# Patient Record
Sex: Female | Born: 1960 | Race: Black or African American | Hispanic: No | Smoking: Current every day smoker
Health system: Southern US, Community
[De-identification: ages and names within clinical notes are randomized; demographics above are authoritative.]

## PROBLEM LIST (undated history)

## (undated) DIAGNOSIS — I10 Essential (primary) hypertension: Secondary | ICD-10-CM

## (undated) HISTORY — PX: BREAST SURGERY: SHX581

## (undated) HISTORY — DX: Essential (primary) hypertension: I10

## (undated) HISTORY — PX: HERNIA REPAIR: SHX51

---

## 2000-05-23 ENCOUNTER — Encounter: Payer: Self-pay | Admitting: Emergency Medicine

## 2000-05-23 ENCOUNTER — Emergency Department (HOSPITAL_COMMUNITY): Admission: EM | Admit: 2000-05-23 | Discharge: 2000-05-23 | Payer: Self-pay | Admitting: Emergency Medicine

## 2002-03-11 ENCOUNTER — Other Ambulatory Visit: Admission: RE | Admit: 2002-03-11 | Discharge: 2002-03-11 | Payer: Self-pay | Admitting: Family Medicine

## 2002-03-11 ENCOUNTER — Other Ambulatory Visit: Admission: RE | Admit: 2002-03-11 | Discharge: 2002-03-11 | Payer: Self-pay | Admitting: Anesthesiology

## 2004-02-12 ENCOUNTER — Emergency Department (HOSPITAL_COMMUNITY): Admission: EM | Admit: 2004-02-12 | Discharge: 2004-02-12 | Payer: Self-pay | Admitting: *Deleted

## 2005-08-23 ENCOUNTER — Ambulatory Visit: Payer: Self-pay | Admitting: Family Medicine

## 2005-08-25 ENCOUNTER — Ambulatory Visit (HOSPITAL_COMMUNITY): Admission: RE | Admit: 2005-08-25 | Discharge: 2005-08-25 | Payer: Self-pay | Admitting: Family Medicine

## 2005-09-19 ENCOUNTER — Ambulatory Visit: Payer: Self-pay | Admitting: Family Medicine

## 2005-12-26 ENCOUNTER — Emergency Department (HOSPITAL_COMMUNITY): Admission: EM | Admit: 2005-12-26 | Discharge: 2005-12-26 | Payer: Self-pay | Admitting: Family Medicine

## 2005-12-26 ENCOUNTER — Ambulatory Visit: Payer: Self-pay | Admitting: Nurse Practitioner

## 2006-08-27 ENCOUNTER — Emergency Department (HOSPITAL_COMMUNITY): Admission: EM | Admit: 2006-08-27 | Discharge: 2006-08-27 | Payer: Self-pay | Admitting: Emergency Medicine

## 2006-08-29 ENCOUNTER — Emergency Department (HOSPITAL_COMMUNITY): Admission: EM | Admit: 2006-08-29 | Discharge: 2006-08-29 | Payer: Self-pay | Admitting: Family Medicine

## 2006-09-26 ENCOUNTER — Ambulatory Visit: Payer: Self-pay | Admitting: Internal Medicine

## 2006-11-03 ENCOUNTER — Emergency Department (HOSPITAL_COMMUNITY): Admission: EM | Admit: 2006-11-03 | Discharge: 2006-11-03 | Payer: Self-pay | Admitting: Family Medicine

## 2008-05-01 ENCOUNTER — Emergency Department (HOSPITAL_COMMUNITY): Admission: EM | Admit: 2008-05-01 | Discharge: 2008-05-01 | Payer: Self-pay | Admitting: Emergency Medicine

## 2008-05-05 ENCOUNTER — Emergency Department (HOSPITAL_COMMUNITY): Admission: EM | Admit: 2008-05-05 | Discharge: 2008-05-05 | Payer: Self-pay | Admitting: Emergency Medicine

## 2009-09-23 ENCOUNTER — Emergency Department (HOSPITAL_COMMUNITY): Admission: EM | Admit: 2009-09-23 | Discharge: 2009-09-23 | Payer: Self-pay | Admitting: Family Medicine

## 2009-10-01 IMAGING — CR DG CHEST 2V
2 series · 2 of 2 positions shown · non-contrast
Comparison: 08/25/2005]

CLINICAL DATA: Cold/fever/chest pain

CHEST - 2 VIEW

[view not recorded (1 of 2)]
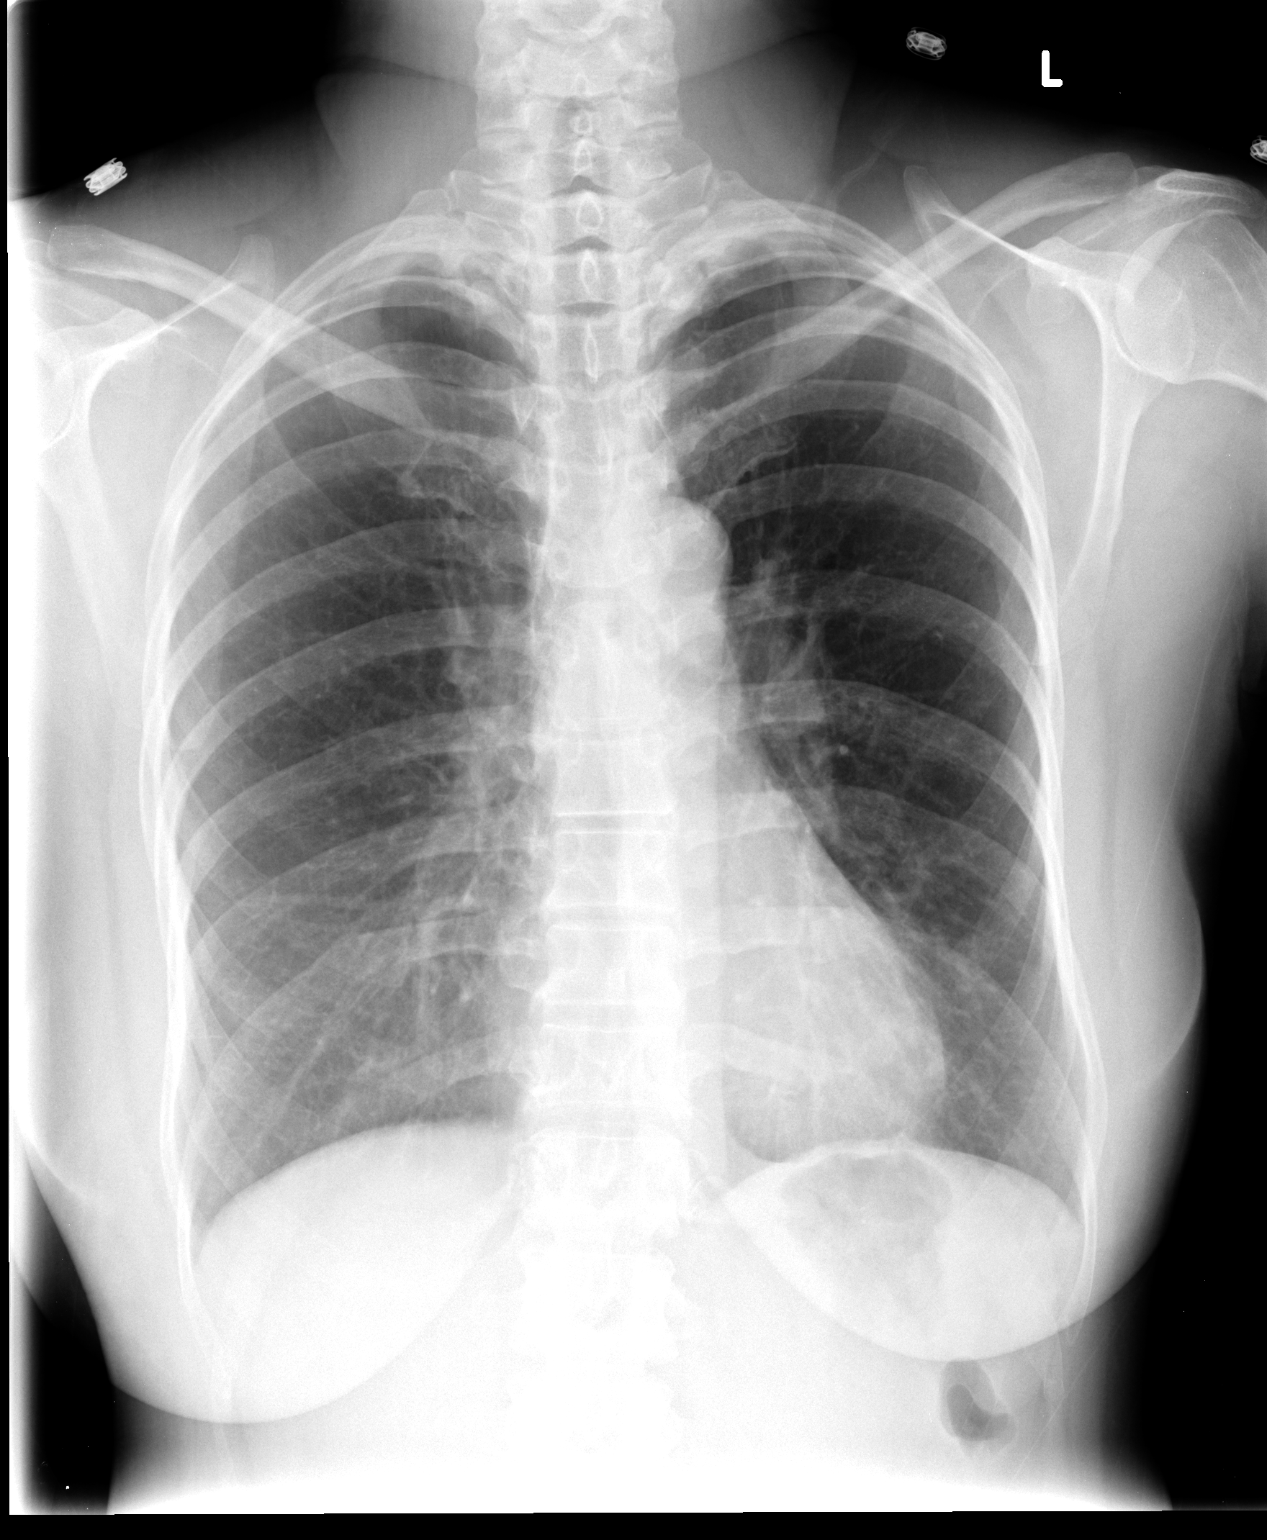

[view not recorded (2 of 2)]
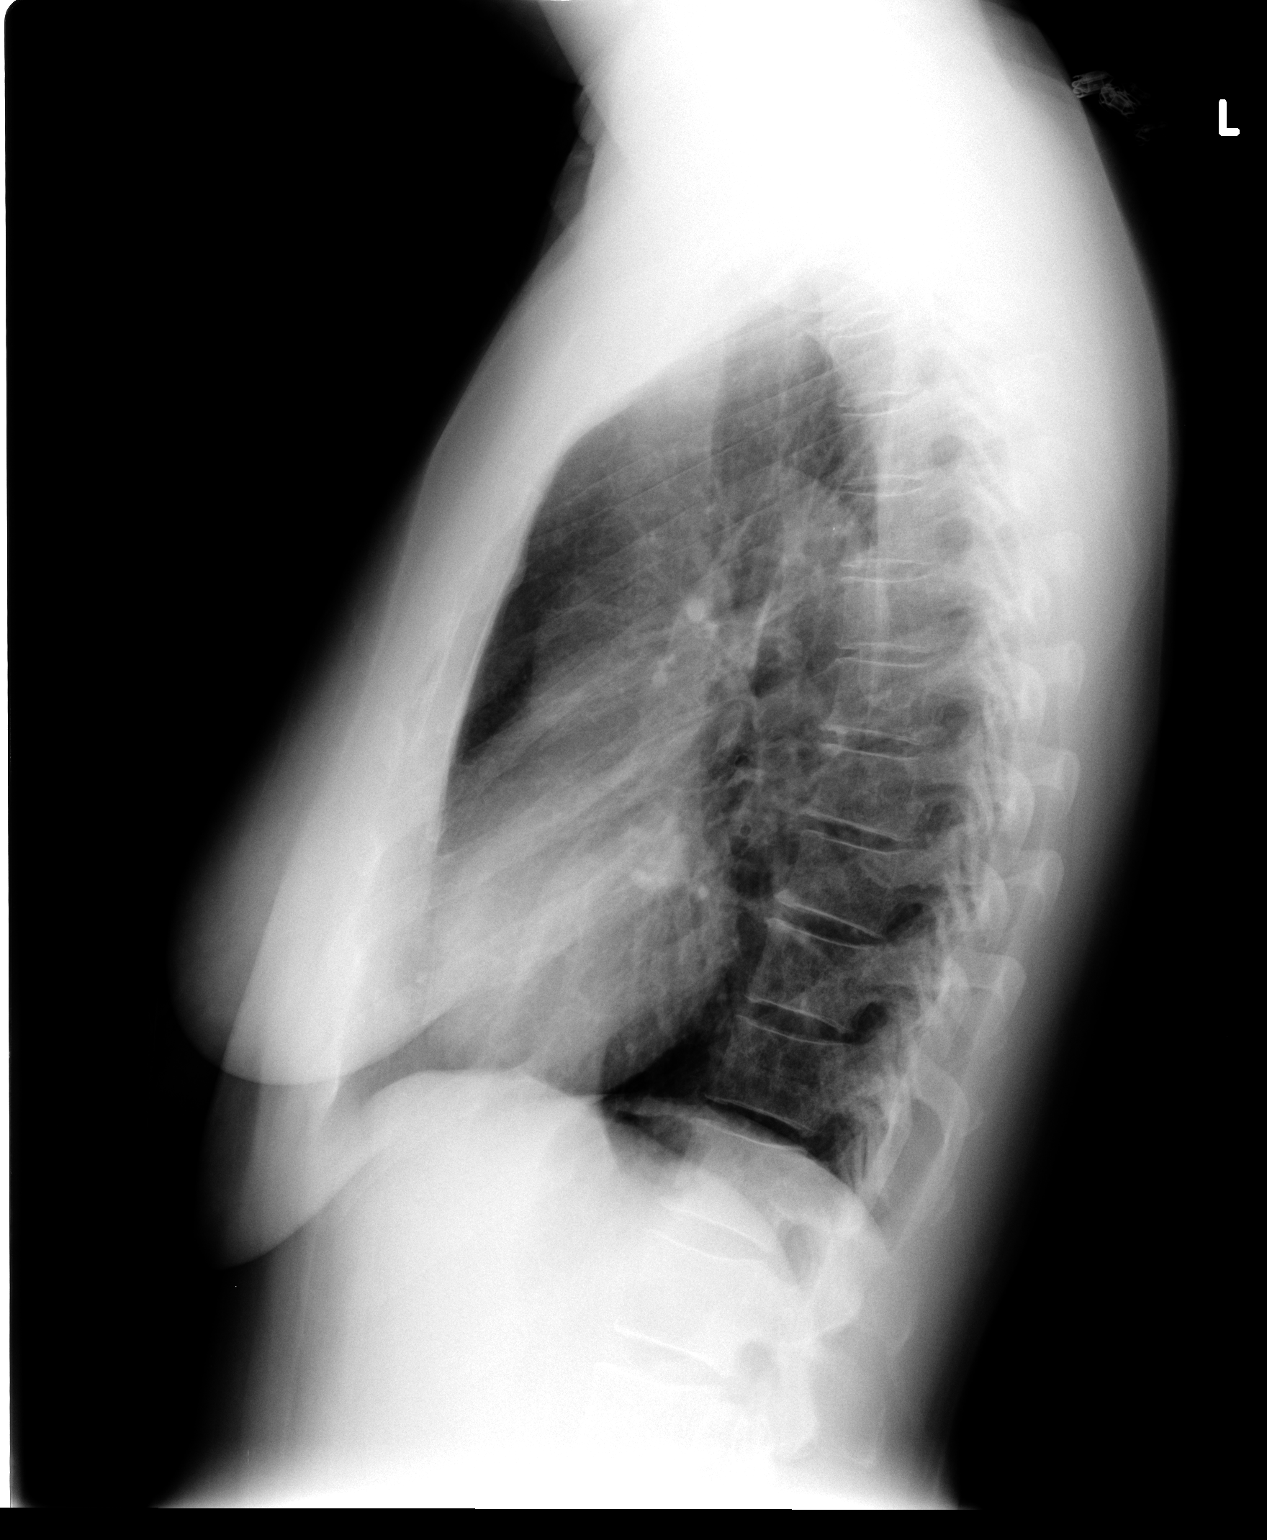

[2 of 2 positions shown; findings below may reference images not displayed]

FINDINGS: Heart and mediastinal contours normal.  Lungs clear.  No
pleural fluid or osseous lesions.
IMPRESSION: No active disease.

## 2010-12-15 ENCOUNTER — Inpatient Hospital Stay (INDEPENDENT_AMBULATORY_CARE_PROVIDER_SITE_OTHER)
Admission: RE | Admit: 2010-12-15 | Discharge: 2010-12-15 | Disposition: A | Discharge status: Home / Self Care | Payer: Self-pay | Source: Ambulatory Visit | Attending: Family Medicine | Admitting: Family Medicine

## 2010-12-15 DIAGNOSIS — M779 Enthesopathy, unspecified: Secondary | ICD-10-CM

## 2011-12-20 ENCOUNTER — Emergency Department (HOSPITAL_COMMUNITY): Payer: No Typology Code available for payment source

## 2011-12-20 ENCOUNTER — Emergency Department (HOSPITAL_COMMUNITY)
Admission: EM | Admit: 2011-12-20 | Discharge: 2011-12-20 | Disposition: A | Discharge status: Home / Self Care | Payer: No Typology Code available for payment source | Attending: Emergency Medicine | Admitting: Emergency Medicine

## 2011-12-20 ENCOUNTER — Encounter (HOSPITAL_COMMUNITY): Payer: Self-pay | Admitting: Emergency Medicine

## 2011-12-20 DIAGNOSIS — M545 Low back pain, unspecified: Secondary | ICD-10-CM | POA: Insufficient documentation

## 2011-12-20 DIAGNOSIS — S39012A Strain of muscle, fascia and tendon of lower back, initial encounter: Secondary | ICD-10-CM

## 2011-12-20 DIAGNOSIS — S93409A Sprain of unspecified ligament of unspecified ankle, initial encounter: Secondary | ICD-10-CM | POA: Insufficient documentation

## 2011-12-20 DIAGNOSIS — S339XXA Sprain of unspecified parts of lumbar spine and pelvis, initial encounter: Secondary | ICD-10-CM | POA: Insufficient documentation

## 2011-12-20 DIAGNOSIS — M25579 Pain in unspecified ankle and joints of unspecified foot: Secondary | ICD-10-CM | POA: Insufficient documentation

## 2011-12-20 DIAGNOSIS — S93401A Sprain of unspecified ligament of right ankle, initial encounter: Secondary | ICD-10-CM

## 2011-12-20 DIAGNOSIS — M79609 Pain in unspecified limb: Secondary | ICD-10-CM | POA: Insufficient documentation

## 2011-12-20 DIAGNOSIS — Y92410 Unspecified street and highway as the place of occurrence of the external cause: Secondary | ICD-10-CM | POA: Insufficient documentation

## 2011-12-20 MED ORDER — IBUPROFEN 800 MG PO TABS
800.0000 mg | ORAL_TABLET | Freq: Once | ORAL | Status: AC
  Administered 2011-12-20: 800 mg via ORAL
  Filled 2011-12-20: qty 1

## 2011-12-20 MED ORDER — IBUPROFEN 800 MG PO TABS: 800.0000 mg | ORAL_TABLET | Freq: Three times a day (TID) | ORAL | Status: AC

## 2011-12-20 MED ORDER — DIAZEPAM 5 MG PO TABS: 5.0000 mg | ORAL_TABLET | Freq: Three times a day (TID) | ORAL | Status: AC | PRN
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
## 2011-12-20 NOTE — ED Notes (Signed)
Patient transported to X-ray 
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
## 2011-12-22 NOTE — ED Provider Notes (Signed)
Medical screening examination/treatment/procedure(s) were performed by non-physician practitioner and as supervising physician I was immediately available for consultation/collaboration.  Germaine Shenker, MD 12/22/11 0849 

## 2013-01-15 ENCOUNTER — Encounter (HOSPITAL_COMMUNITY): Payer: Self-pay | Admitting: *Deleted

## 2013-01-15 ENCOUNTER — Emergency Department (INDEPENDENT_AMBULATORY_CARE_PROVIDER_SITE_OTHER)
Admission: EM | Admit: 2013-01-15 | Discharge: 2013-01-15 | Disposition: A | Discharge status: Home / Self Care | Payer: Self-pay | Source: Home / Self Care | Attending: Emergency Medicine | Admitting: Emergency Medicine

## 2013-01-15 DIAGNOSIS — K047 Periapical abscess without sinus: Secondary | ICD-10-CM

## 2013-01-15 MED ORDER — HYDROCODONE-ACETAMINOPHEN 5-325 MG PO TABS
ORAL_TABLET | ORAL | Status: AC
  Filled 2013-01-15: qty 2

## 2013-01-15 MED ORDER — PENICILLIN V POTASSIUM 500 MG PO TABS: 500.0000 mg | ORAL_TABLET | Freq: Three times a day (TID) | ORAL | Status: DC

## 2013-01-15 MED ORDER — METRONIDAZOLE 500 MG PO TABS: 500.0000 mg | ORAL_TABLET | Freq: Three times a day (TID) | ORAL | Status: DC

## 2013-01-15 MED ORDER — HYDROCODONE-ACETAMINOPHEN 5-325 MG PO TABS: ORAL_TABLET | ORAL | Status: DC

## 2013-01-15 MED ORDER — HYDROCODONE-ACETAMINOPHEN 5-325 MG PO TABS
2.0000 | ORAL_TABLET | Freq: Once | ORAL | Status: AC
  Administered 2013-01-15: 2 via ORAL

## 2013-01-15 NOTE — ED Notes (Signed)
Pt         Reports     4   Days       Of  A  Toothache           r  Side  Of  Face   X  4  Days   -     Pt  Reports  The  Symptoms  Have  Not    Been releived  By  otc  meds             She  Is  Sitting  Upright omn   Exam table  In no  Severe  Distress  Speaking in  Complete   sentances

## 2013-01-15 NOTE — ED Provider Notes (Signed)
Chief Complaint:   Chief Complaint  Patient presents with  . Dental Pain    History of Present Illness:   Andrea Price is a 52 year old female who has a six-day history of pain in her left, upper, second molar. There is swelling of the gingiva and also facial swelling. She has difficulty opening her mouth and can only open her mouth about a centimeter. She's had no fever or chills. Has had some headache. No neck pain or swelling and no difficulty breathing or swallowing.  Review of Systems:  Other than noted above, the patient denies any of the following symptoms: Systemic:  No fever, chills,  Or sweats. ENT:  No headache, ear ache, sore throat, nasal congestion, facial pain, or swelling. Lymphatic:  No adenopathy. Lungs:  No coughing, wheezing or shortness of breath.  PMFSH:  Past medical history, family history, social history, meds, and allergies were reviewed.   Physical Exam:   Vital signs:  BP 139/86  Pulse 98  Temp(Src) 98.2 F (36.8 C) (Oral)  Resp 18  SpO2 99%  LMP 12/16/2011 General:  Alert, oriented, in no distress. ENT:  TMs and canals normal.  Nasal mucosa normal. Mouth exam:  She only open her mouth about a centimeter, some mouth exam is limited. Her left, upper, second molar was tender to touch. There was no visible decay or gingival swelling. No cervical swelling of the floor the mouth or of the pharynx. Airway was patent. Neck:  No swelling or adenopathy. Lungs:  Breath sounds clear and equal bilaterally.  No wheezes, rales or rhonchi. Heart:  Regular rhythm.  No gallops or murmers. Skin:  Clear, warm and dry.   Course in Urgent Care Center:   She was given Norco 5/325 2 tablets by mouth for pain.  Assessment:  The encounter diagnosis was Dental abscess.  No evidence for airway obstruction or soft tissue swelling. She will need antibiotics followed by a visit to the dentist to see what can be done about the tooth.  Plan:   1.  The following meds were  prescribed:   Discharge Medication List as of 01/15/2013 12:16 PM    START taking these medications   Details  HYDROcodone-acetaminophen (NORCO/VICODIN) 5-325 MG per tablet 1 to 2 tabs every 4 to 6 hours as needed for pain., Print    metroNIDAZOLE (FLAGYL) 500 MG tablet Take 1 tablet (500 mg total) by mouth 3 (three) times daily., Starting 01/15/2013, Until Discontinued, Normal    penicillin v potassium (VEETID) 500 MG tablet Take 1 tablet (500 mg total) by mouth 3 (three) times daily., Starting 01/15/2013, Until Discontinued, Normal       2.  The patient was instructed in symptomatic care and handouts were given.  Suggested sleeping with head of bed elevated and hot salt water mouthwash. 3.  The patient was told to return if becoming worse in any way, if no better in 3 or 4 days, and given some red flag symptoms such as difficulty swallowing or breathing that would indicate earlier return, especially difficulty breathing. 4.  The patient was told to follow up with a dentist as soon as possible.    Reuben Likes, MD 01/15/13 2015

## 2013-01-20 ENCOUNTER — Ambulatory Visit: Payer: Self-pay

## 2013-10-11 ENCOUNTER — Encounter (HOSPITAL_COMMUNITY): Payer: Self-pay | Admitting: Emergency Medicine

## 2013-10-11 ENCOUNTER — Emergency Department (INDEPENDENT_AMBULATORY_CARE_PROVIDER_SITE_OTHER)
Admission: EM | Admit: 2013-10-11 | Discharge: 2013-10-11 | Disposition: A | Discharge status: Home / Self Care | Payer: Self-pay | Source: Home / Self Care | Attending: Family Medicine | Admitting: Family Medicine

## 2013-10-11 DIAGNOSIS — S81812A Laceration without foreign body, left lower leg, initial encounter: Secondary | ICD-10-CM

## 2013-10-11 DIAGNOSIS — Z23 Encounter for immunization: Secondary | ICD-10-CM

## 2013-10-11 DIAGNOSIS — S81809A Unspecified open wound, unspecified lower leg, initial encounter: Secondary | ICD-10-CM

## 2013-10-11 MED ORDER — TETANUS-DIPHTH-ACELL PERTUSSIS 5-2.5-18.5 LF-MCG/0.5 IM SUSP
0.5000 mL | Freq: Once | INTRAMUSCULAR | Status: AC
  Administered 2013-10-11: 0.5 mL via INTRAMUSCULAR

## 2013-10-11 MED ORDER — SULFAMETHOXAZOLE-TRIMETHOPRIM 800-160 MG PO TABS: 2.0000 | ORAL_TABLET | Freq: Two times a day (BID) | ORAL | Status: DC

## 2013-10-11 MED ORDER — TETANUS-DIPHTH-ACELL PERTUSSIS 5-2.5-18.5 LF-MCG/0.5 IM SUSP
INTRAMUSCULAR | Status: AC
  Filled 2013-10-11: qty 0.5

## 2013-10-11 NOTE — ED Notes (Signed)
Laceration to left lower leg around 2. A.m. This morning.  States slipped on ice falling on steps.  Pt has cleaned wound with peroxide and wrapped with bandage.

## 2013-10-11 NOTE — Discharge Instructions (Signed)
Thank you for coming in today. Take 2 Septra antibiotic tablets twice daily for one week Take up to 2 Aleve twice daily for pain control Return if the wound becomes very red or starts oozing pus or becomes very painful.  Return in one week for suture removal.  Keep the wound covered with ointment  Laceration Care, Adult A laceration is a cut or lesion that goes through all layers of the skin and into the tissue just beneath the skin. TREATMENT  Some lacerations may not require closure. Some lacerations may not be able to be closed due to an increased risk of infection. It is important to see your caregiver as soon as possible after an injury to minimize the risk of infection and maximize the opportunity for successful closure. If closure is appropriate, pain medicines may be given, if needed. The wound will be cleaned to help prevent infection. Your caregiver will use stitches (sutures), staples, wound glue (adhesive), or skin adhesive strips to repair the laceration. These tools bring the skin edges together to allow for faster healing and a better cosmetic outcome. However, all wounds will heal with a scar. Once the wound has healed, scarring can be minimized by covering the wound with sunscreen during the day for 1 full year. HOME CARE INSTRUCTIONS  For sutures or staples:  Keep the wound clean and dry.  If you were given a bandage (dressing), you should change it at least once a day. Also, change the dressing if it becomes wet or dirty, or as directed by your caregiver.  Wash the wound with soap and water 2 times a day. Rinse the wound off with water to remove all soap. Pat the wound dry with a clean towel.  After cleaning, apply a thin layer of the antibiotic ointment as recommended by your caregiver. This will help prevent infection and keep the dressing from sticking.  You may shower as usual after the first 24 hours. Do not soak the wound in water until the sutures are removed.  Only  take over-the-counter or prescription medicines for pain, discomfort, or fever as directed by your caregiver.  Get your sutures or staples removed as directed by your caregiver. For skin adhesive strips:  Keep the wound clean and dry.  Do not get the skin adhesive strips wet. You may bathe carefully, using caution to keep the wound dry.  If the wound gets wet, pat it dry with a clean towel.  Skin adhesive strips will fall off on their own. You may trim the strips as the wound heals. Do not remove skin adhesive strips that are still stuck to the wound. They will fall off in time. For wound adhesive:  You may briefly wet your wound in the shower or bath. Do not soak or scrub the wound. Do not swim. Avoid periods of heavy perspiration until the skin adhesive has fallen off on its own. After showering or bathing, gently pat the wound dry with a clean towel.  Do not apply liquid medicine, cream medicine, or ointment medicine to your wound while the skin adhesive is in place. This may loosen the film before your wound is healed.  If a dressing is placed over the wound, be careful not to apply tape directly over the skin adhesive. This may cause the adhesive to be pulled off before the wound is healed.  Avoid prolonged exposure to sunlight or tanning lamps while the skin adhesive is in place. Exposure to ultraviolet light in the first  year will darken the scar.  The skin adhesive will usually remain in place for 5 to 10 days, then naturally fall off the skin. Do not pick at the adhesive film. You may need a tetanus shot if:  You cannot remember when you had your last tetanus shot.  You have never had a tetanus shot. If you get a tetanus shot, your arm may swell, get red, and feel warm to the touch. This is common and not a problem. If you need a tetanus shot and you choose not to have one, there is a rare chance of getting tetanus. Sickness from tetanus can be serious. SEEK MEDICAL CARE IF:    You have redness, swelling, or increasing pain in the wound.  You see a red line that goes away from the wound.  You have yellowish-white fluid (pus) coming from the wound.  You have a fever.  You notice a bad smell coming from the wound or dressing.  Your wound breaks open before or after sutures have been removed.  You notice something coming out of the wound such as wood or glass.  Your wound is on your hand or foot and you cannot move a finger or toe. SEEK IMMEDIATE MEDICAL CARE IF:   Your pain is not controlled with prescribed medicine.  You have severe swelling around the wound causing pain and numbness or a change in color in your arm, hand, leg, or foot.  Your wound splits open and starts bleeding.  You have worsening numbness, weakness, or loss of function of any joint around or beyond the wound.  You develop painful lumps near the wound or on the skin anywhere on your body. MAKE SURE YOU:   Understand these instructions.  Will watch your condition.  Will get help right away if you are not doing well or get worse. Document Released: 08/06/2005 Document Revised: 10/29/2011 Document Reviewed: 01/30/2011 [facility] Patient Information 2014 [place], [place].

## 2013-10-11 NOTE — ED Provider Notes (Signed)
Andrea Price is a 53 y.o. female who presents to Urgent Care today for left chin laceration. Patient fell last night about 2:00 in the morning striking her anterior left shin on concrete steps. She cleaned the wound with hydrogen peroxide and applied a bandage. She presented to the urgent care approximately 11 hours after the initial injury. She denies any significant pain weakness with foot motion. She denies any limb. No radiating pain weakness or numbness. No fevers or chills. She cannot recall her last tetanus vaccination.   History reviewed. No pertinent past medical history. History  Substance Use Topics  . Smoking status: Never Smoker   . Smokeless tobacco: Not on file  . Alcohol Use: No   ROS as above Medications: No current facility-administered medications for this encounter.   Current Outpatient Prescriptions  Medication Sig Dispense Refill  . sulfamethoxazole-trimethoprim (SEPTRA DS) 800-160 MG per tablet Take 2 tablets by mouth 2 (two) times daily.  28 tablet  0    Exam:  BP 162/82  Pulse 116  Temp(Src) 98.1 F (36.7 C) (Oral)  Resp 20  SpO2 100%  LMP 12/16/2011 Gen: Well NAD SKIN: 3 cm laceration along the medial border of the skin overlying the left tibia. The laceration extends deep beyond the dermis however it does not extend into or through the fascia or to the bone.   Laceration repair left tibia:  Consent obtained and timeout performed. Skin cleaned with alcohol and 4 mL of 2% lidocaine with epinephrine were injected around the wound achieving good anesthesia. The wound was copiously irrigated with sterile saline. The wound was prepped and draped in the usual sterile fashion.  The wound edges were debrided and transient achieving good healthy tissue.  3 horizontal mattress sutures using 4-0 Prolene were used to close the wound. The Betadine was wash off of the skin and a sterile dressing was applied.  Patient tolerated the procedure well    Assessment  and Plan: 53 y.o. female with laceration to the skin overlying the left tibia. This is a contaminated and 12 hour old wound. I am concerned about the possibility of infection therefore did not use absorbable sutures.  Additionally I debrided the wound edges and copiously irrigated the wound.  Additionally we'll use prophylactic Septra antibiotics for the possibility of wound infection.  Patient will return in one week or sooner for wound recheck and suture removal.  Discussed warning signs or symptoms. Please see discharge instructions. Patient expresses understanding.    Rodolph BongEvan S Lacara Dunsworth, MD 10/11/13 [phone]

## 2013-10-18 ENCOUNTER — Encounter (HOSPITAL_COMMUNITY): Payer: Self-pay | Admitting: Emergency Medicine

## 2013-10-18 ENCOUNTER — Emergency Department (INDEPENDENT_AMBULATORY_CARE_PROVIDER_SITE_OTHER)
Admission: EM | Admit: 2013-10-18 | Discharge: 2013-10-18 | Disposition: A | Discharge status: Home / Self Care | Payer: Self-pay | Source: Home / Self Care

## 2013-10-18 DIAGNOSIS — S81009A Unspecified open wound, unspecified knee, initial encounter: Secondary | ICD-10-CM

## 2013-10-18 DIAGNOSIS — S81809A Unspecified open wound, unspecified lower leg, initial encounter: Secondary | ICD-10-CM

## 2013-10-18 DIAGNOSIS — S91009A Unspecified open wound, unspecified ankle, initial encounter: Secondary | ICD-10-CM

## 2013-10-18 DIAGNOSIS — S81819A Laceration without foreign body, unspecified lower leg, initial encounter: Secondary | ICD-10-CM

## 2013-10-18 NOTE — ED Notes (Signed)
Presents for suture removal left anterior lower leg.  Wound well-approximated; no S/S infection.

## 2013-10-18 NOTE — Discharge Instructions (Signed)
Thank you for coming in today. The steri strips should fall off in about 1 week.  Stop the antibiotics.  Come back as needed.   Scar Minimization You will have a scar anytime you have surgery and a cut is made in the skin or you have something removed from your skin (mole, skin cancer, cyst). Although scars are unavoidable following surgery, there are ways to minimize their appearance. It is important to follow all the instructions you receive from your caregiver about wound care. How your wound heals will influence the appearance of your scar. If you do not follow the wound care instructions as directed, complications such as infection may occur. Wound instructions include keeping the wound clean, moist, and not letting the wound form a scab. Some people form scars that are raised and lumpy (hypertrophic) or larger than the initial wound (keloidal). HOME CARE INSTRUCTIONS   Follow wound care instructions as directed.  Keep the wound clean by washing it with soap and water.  Keep the wound moist with provided antibiotic cream or petroleum jelly until completely healed. Moisten twice a day for about 2 weeks.  Get stitches (sutures) taken out at the scheduled time.  Avoid touching or manipulating your wound unless needed. Wash your hands thoroughly before and after touching your wound.  Follow all restrictions such as limits on exercise or work. This depends on where your scar is located.  Keep the scar protected from sunburn. Cover the scar with sunscreen/sunblock with SPF 30 or higher.  Gently massage the scar using a circular motion to help minimize the appearance of the scar. Do this only after the wound has closed and all the sutures have been removed.  For hypertrophic or keloidal scars, there are several ways to treat and minimize their appearance. Methods include compression therapy, intralesional corticosteroids, laser therapy, or surgery. These methods are performed by your  caregiver. Remember that the scar may appear lighter or darker than your normal skin color. This difference in color should even out with time. SEEK MEDICAL CARE IF:   You have a fever.  You develop signs of infection such as pain, redness, pus, and warmth.  You have questions or concerns. Document Released: 01/24/2010 Document Revised: 10/29/2011 Document Reviewed: 01/24/2010 [facility] Patient Information 2014 [place], [place].

## 2013-10-18 NOTE — ED Provider Notes (Signed)
Andrea Price is a 53 y.o. female who presents to Urgent Care today for left chin laceration. Patient was seen last week for a laceration involving her left lower leg. She's here today for suture removal. She denies any pain fevers or chills. She is taking her Bactrim antibiotic.    History reviewed. No pertinent past medical history. History  Substance Use Topics  . Smoking status: Never Smoker   . Smokeless tobacco: Not on file  . Alcohol Use: No   ROS as above Medications: No current facility-administered medications for this encounter.   No current outpatient prescriptions on file.    Exam:  BP 130/108  Pulse 95  Temp(Src) 98.1 F (36.7 C) (Oral)  Resp 19  SpO2 100%  LMP 12/16/2011 Gen: Well NAD SKIN: Well appearing laceration no erythema exudate or tenderness.  The three horizontal mattress sutures were removed and the wound was treated with Steri-Strips.  Assessment and Plan: 53 y.o. female with laceration. Followup as needed  Discussed warning signs or symptoms. Please see discharge instructions. Patient expresses understanding.    Rodolph BongEvan S Corey, MD 10/18/13 [phone]

## 2014-11-11 ENCOUNTER — Emergency Department (HOSPITAL_COMMUNITY): Payer: Self-pay

## 2014-11-11 ENCOUNTER — Encounter (HOSPITAL_COMMUNITY): Payer: Self-pay | Admitting: Emergency Medicine

## 2014-11-11 ENCOUNTER — Emergency Department (HOSPITAL_COMMUNITY)
Admission: EM | Admit: 2014-11-11 | Discharge: 2014-11-11 | Disposition: A | Discharge status: Home / Self Care | Payer: Self-pay | Attending: Emergency Medicine | Admitting: Emergency Medicine

## 2014-11-11 DIAGNOSIS — R200 Anesthesia of skin: Secondary | ICD-10-CM

## 2014-11-11 DIAGNOSIS — M79621 Pain in right upper arm: Secondary | ICD-10-CM | POA: Insufficient documentation

## 2014-11-11 DIAGNOSIS — M542 Cervicalgia: Secondary | ICD-10-CM | POA: Insufficient documentation

## 2014-11-11 DIAGNOSIS — M546 Pain in thoracic spine: Secondary | ICD-10-CM | POA: Insufficient documentation

## 2014-11-11 DIAGNOSIS — N6310 Unspecified lump in the right breast, unspecified quadrant: Secondary | ICD-10-CM

## 2014-11-11 DIAGNOSIS — N63 Unspecified lump in breast: Secondary | ICD-10-CM | POA: Insufficient documentation

## 2014-11-11 MED ORDER — IBUPROFEN 800 MG PO TABS: 800.0000 mg | ORAL_TABLET | Freq: Three times a day (TID) | ORAL | Status: DC | PRN

## 2014-11-11 NOTE — ED Provider Notes (Signed)
CSN: [id number]     Arrival date & time 11/11/14  1336 History  This chart was scribed for non-physician practitioner Andrea Price working with Azalia Bilis, MD by Andrea Price, ED Scribe. This patient was seen in room WTR7/WTR7 and the patient's care was started at 2:47 PM.    Chief Complaint  Patient presents with  . Extremity Pain   Patient is a 54 y.o. female presenting with extremity pain. The history is provided by the patient. No language interpreter was used.  Extremity Pain Pertinent negatives include no chest pain and no shortness of breath.   HPI Comments: Andrea Price is a 54 y.o. female who presents to the Emergency Department complaining of gradually worsening right arm numbness and right axillary and right upper back pain radiating to the right side of her neck that started a year ago.  It has gradually worsened.  The numbness in her right arm comes and goes and involves the entire arm.  She can feel a lump in her right breast tissue.  She does not have a history of neck injuries.  Denies any other known injury. Her last mammogram was over 30 years ago when diagnosed with lump in her breast.  She denies chest pain, SOB, fever, cough, chills, and difficulty breathing as associated symptoms. She has a family history of cancer.  She has an appointment with [facility] next week, 6 days from today.  Has not been seen for this previously.   History reviewed. No pertinent past medical history. Past Surgical History  Procedure Laterality Date  . Hernia repair     History reviewed. No pertinent family history. History  Substance Use Topics  . Smoking status: Never Smoker   . Smokeless tobacco: Not on file  . Alcohol Use: No   OB History    No data available     Review of Systems  Constitutional: Negative for fever and chills.  Respiratory: Negative for cough and shortness of breath.   Cardiovascular: Negative for chest pain.  Musculoskeletal: Positive for back pain and neck  pain. Negative for joint swelling and neck stiffness.  Skin: Negative for color change and wound.  Allergic/Immunologic: Negative for immunocompromised state.  Neurological: Positive for numbness. Negative for weakness.  Hematological: Does not bruise/bleed easily.  Psychiatric/Behavioral: Negative for self-injury.  All other systems reviewed and are negative.   Allergies  Review of patient's allergies indicates no known allergies.  Home Medications   Prior to Admission medications   Not on File   Triage Vitals: BP 178/97 mmHg  Pulse 87  Temp(Src) 98.2 F (36.8 C) (Oral)  Resp 20  SpO2 97%  LMP 12/16/2011  Physical Exam  Constitutional: She appears well-developed and well-nourished. No distress.  HENT:  Head: Normocephalic and atraumatic.  Neck: Neck supple.  Pulmonary/Chest: Effort normal.    Musculoskeletal:       Back:  Lower extremities:  Strength 5/5, sensation intact, distal pulses intact.   Spine nontender, no crepitus, or stepoffs.    Lymphadenopathy:  Right axilla generally tender.  No palpable lymphadenopathy.    Neurological: She is alert.  Skin: She is not diaphoretic.  Nursing note and vitals reviewed.   ED Course  Procedures (including critical care time)  DIAGNOSTIC STUDIES: Oxygen Saturation is 97% on room air, normal by my interpretation.    COORDINATION OF CARE: 2:58 PM- Will obtain chest, right shoulder and neck xrays.  Will order a mammogram.  The patient agreed to the treatment plan.  Labs Review Labs Reviewed - No data to display  Imaging Review Dg Cervical Spine Complete  11/11/2014   CLINICAL DATA:  Right arm numbness and pain for 1 year, no known injury, initial encounter  EXAM: CERVICAL SPINE  4+ VIEWS  COMPARISON:  None.  FINDINGS: Seven cervical segments are well visualized. Osteophytic changes are noted from C3-C6. No soft tissue changes are noted. Mild neural foraminal narrowing is noted most marked at C5-6 bilaterally. No  acute fracture is seen. The odontoid is within normal limits.  IMPRESSION: Degenerative change without acute abnormality.   Electronically Signed   By: Alcide CleverMark  Lukens M.D.   On: 11/11/2014 16:03   Dg Shoulder Right  11/11/2014   CLINICAL DATA:  Right shoulder and arm pain, no known injury, initial encounter  EXAM: RIGHT SHOULDER - 2+ VIEW  COMPARISON:  None.  FINDINGS: There is no evidence of fracture or dislocation. There is no evidence of arthropathy or other focal bone abnormality. Soft tissues are unremarkable.  IMPRESSION: No acute abnormality noted.   Electronically Signed   By: Alcide CleverMark  Lukens M.D.   On: 11/11/2014 16:04     EKG Interpretation None      MDM   Final diagnoses:  None    Afebrile, nontoxic patient with right axillary pain, right breast lump, right arm numbness and right upper back and right sided neck pain x 1 year, gradually worsening.  The right arm numbness is not dermatomal.  Neurovascularly intact.  Xrays negative.  Ordered outpatient breast and axillary US.  [facility] follow up at appointment in 6 days.    D/C home with PCP next week, Ibuprofen. Discussed result, findings, treatment, and follow up  with patient.  Pt given return precautions.  Pt verbalizes understanding and agrees with plan.        I personally performed the services described in this documentation, which was scribed in my presence. The recorded information has been reviewed and is accurate.    Andrea Dredgemily Tyeshia Cornforth, PA-C 11/11/14 1651  Azalia BilisKevin Campos, MD 11/18/14 [phone]

## 2014-11-11 NOTE — Discharge Instructions (Signed)
Read the information below.  Use the prescribed medication as directed.  Please discuss all new medications with your pharmacist.  You may return to the Emergency Department at any time for worsening condition or any new symptoms that concern you.    If you develop fevers, loss of control of bowel or bladder, weakness or numbness in your arms or legs, or are unable to walk, return to the ER for a recheck.  °

## 2014-11-11 NOTE — ED Notes (Signed)
States pain in right arm on-going for a year. States has been having increasingly intermittent right arm numbness/coolness to finger tips. Says she's felt lumps in her right breast/shoulder area recently and states cancer runs in her family. Decided to come today d/t the increasing pain and occasional numbness to arm. Arm warm/dry to touch, 1+ pulse in rt wrist.

## 2014-11-12 ENCOUNTER — Other Ambulatory Visit (HOSPITAL_COMMUNITY): Payer: Self-pay | Admitting: Physician Assistant

## 2014-11-23 ENCOUNTER — Other Ambulatory Visit (HOSPITAL_COMMUNITY): Payer: Self-pay | Admitting: Physician Assistant

## 2014-11-23 DIAGNOSIS — N644 Mastodynia: Secondary | ICD-10-CM

## 2014-11-23 DIAGNOSIS — N63 Unspecified lump in unspecified breast: Secondary | ICD-10-CM

## 2014-12-30 ENCOUNTER — Ambulatory Visit (HOSPITAL_COMMUNITY): Payer: Self-pay

## 2015-01-10 ENCOUNTER — Other Ambulatory Visit (HOSPITAL_COMMUNITY): Payer: Self-pay | Admitting: *Deleted

## 2015-01-10 DIAGNOSIS — N63 Unspecified lump in unspecified breast: Secondary | ICD-10-CM

## 2015-01-20 ENCOUNTER — Ambulatory Visit (HOSPITAL_COMMUNITY): Payer: Self-pay

## 2015-01-20 ENCOUNTER — Other Ambulatory Visit: Payer: Self-pay

## 2015-02-02 ENCOUNTER — Other Ambulatory Visit (HOSPITAL_COMMUNITY): Payer: Self-pay | Admitting: *Deleted

## 2015-02-02 DIAGNOSIS — N63 Unspecified lump in unspecified breast: Secondary | ICD-10-CM

## 2015-02-03 ENCOUNTER — Ambulatory Visit (HOSPITAL_COMMUNITY)
Admission: RE | Admit: 2015-02-03 | Discharge: 2015-02-03 | Disposition: A | Discharge status: Home / Self Care | Payer: Self-pay | Source: Ambulatory Visit | Attending: Obstetrics and Gynecology | Admitting: Obstetrics and Gynecology

## 2015-02-03 ENCOUNTER — Encounter (HOSPITAL_COMMUNITY): Payer: Self-pay

## 2015-02-03 VITALS — BP 124/72 | Temp 98.4°F | Ht 67.0 in | Wt 162.0 lb

## 2015-02-03 DIAGNOSIS — Z01419 Encounter for gynecological examination (general) (routine) without abnormal findings: Secondary | ICD-10-CM

## 2015-02-03 DIAGNOSIS — N6322 Unspecified lump in the left breast, upper inner quadrant: Secondary | ICD-10-CM

## 2015-02-03 DIAGNOSIS — N6312 Unspecified lump in the right breast, upper inner quadrant: Secondary | ICD-10-CM

## 2015-02-03 NOTE — Progress Notes (Signed)
Patient ID: Andrea Price, female   DOB: [birth year], 54 y.o.   MRN: [id number] Gave smoking cessation resources to patient and quit line information.

## 2015-02-03 NOTE — Progress Notes (Signed)
CLINIC:  [facility] ([facility]) [facility]  REASON FOR VISIT: Well-woman exam with routine gynecological exam  HISTORY OF PRESENT ILLNESS:  [name] is a 54 y.o. female who presents to the [facility] today for clinical breast exam and routine gynecological exam.  She reports feeling a lump in bilateral breasts that have been there for 1 year.  Pain is intermittent, but as high as 8/10.  She denies any skin redness or nipple discharge from either breast.  She also reports a right axillary lump that has also been there for 1 year and is painful to 10/10.  She reported recently being seen by a physician in the community at [facility], who noted bilateral breast lumps and referred her to [facility] clinic.  She denies any pelvic complaints. Her last pap smear was 20 years ago and was negative.  No history of abnormal pap smears per patient.   REVIEW OF SYSTEMS:  Denies pelvic fullness, pressure, or pain.  Breast symptoms per HPI.    ALLERGIES: No Known Allergies  CURRENT MEDICATIONS:  Current Outpatient Prescriptions on File Prior to Encounter  Medication Sig Dispense Refill  . ibuprofen (ADVIL,MOTRIN) 800 MG tablet Take 1 tablet (800 mg total) by mouth every 8 (eight) hours as needed for mild pain or moderate pain. (Patient not taking: Reported on 02/03/2015) 15 tablet 0   No current facility-administered medications on file prior to encounter.     PHYSICAL EXAM:  Vitals:  Filed Vitals:   02/03/15 1536  BP: 124/72  Temp: 98.4 F (36.9 C)    General: Well-nourished, well-appearing female in no acute distress.  She is unaccompanied in clinic today.  Stoney Bang, LPN was present during physical exam for this patient.  Breasts: Bilateral breasts exposed and observed with patient standing (arms at side, arms on hips, arms on hips flexed forward, and arms over head).  No gross abnormalities including breast skin puckering or dimpling noted on observation.   Breasts symmetrical without evidence of skin redness, thickening, or peau d'orange appearance. No nipple retraction or nipple discharge noted bilaterally.  Her breasts are extremely tender on exam, particularly in the medial and lateral aspects of both breasts. Very small right breast mobile nodule, measuring  <0.5 cm, at 10 o'clock position.  Identical very small left breast mobile nodule, measuring < 0.5 cm at 2 o'clock position.  Axillary lymph nodes: No axillary lymphadenopathy bilaterally.   GU:  -External genitalia: No lesions, swelling, or discharge. Even hair distribution as expected.  -Vagina: Pink, moist. No lesions or discharge noted in vaginal canal.  -Cervix: Cervix pink without lesions. Cervical os patent. No cervical discharge noted.  -Uterus: Bimanual exam demonstrates no uterine mass or tenderness on palpation. Uterus in normal position and normal size.  -Adnexae: Bimanual exam demonstrates no ovarian masses or tenderness on palpation.  -Rectovaginal: No lesions noted to rectum. Rectal tone intact.  No masses or nodularity palpated by bimanual rectovaginal exam.     ASSESSMENT & PLAN:   1. Breast cancer screening: [name] has bilateral palpable breast abnormalities on her clinical breast exam today.  However, these nodules are mirror images (left breast at 10 o'clock position and right breast at 2 o'clock) position.  I have a low suspicion for malignancy for these findings.  She will receive her diagnostic mammogram as scheduled.  She was given instructions and educational materials regarding breast self-awareness. [name] is aware of this plan and agrees with it.   2.  Cervical cancer screening: [name] has a normal pelvic exam today.  A pap smear was completed today per protocol.  She tolerated the procedure without complaints.  She will be contacted by one of our [employer] nurses in the next few weeks to review the results of the pap smear with the patient.     3. Smoking cessation: She currently smokes and received information on smoking cessation today.   [name] was encouraged to ask questions and all questions were answered to her satisfaction.    Lubertha Basque, NP [facility]  [phone]

## 2015-02-04 ENCOUNTER — Other Ambulatory Visit (HOSPITAL_COMMUNITY): Payer: Self-pay | Admitting: Obstetrics and Gynecology

## 2015-02-04 ENCOUNTER — Ambulatory Visit
Admission: RE | Admit: 2015-02-04 | Discharge: 2015-02-04 | Disposition: A | Discharge status: Home / Self Care | Payer: No Typology Code available for payment source | Source: Ambulatory Visit | Attending: Obstetrics and Gynecology | Admitting: Obstetrics and Gynecology

## 2015-02-04 DIAGNOSIS — N63 Unspecified lump in unspecified breast: Secondary | ICD-10-CM

## 2015-02-07 LAB — CYTOLOGY - PAP

## 2015-02-11 ENCOUNTER — Telehealth (HOSPITAL_COMMUNITY): Payer: Self-pay | Admitting: *Deleted

## 2015-02-11 NOTE — Telephone Encounter (Signed)
Telephoned patient at home # and left message to return call to [facility] 

## 2019-10-13 ENCOUNTER — Institutional Professional Consult (permissible substitution): Payer: No Typology Code available for payment source | Admitting: Pulmonary Disease

## 2019-10-27 ENCOUNTER — Institutional Professional Consult (permissible substitution): Payer: No Typology Code available for payment source | Admitting: Pulmonary Disease

## 2019-11-09 ENCOUNTER — Ambulatory Visit (INDEPENDENT_AMBULATORY_CARE_PROVIDER_SITE_OTHER): Payer: BC Managed Care – PPO | Admitting: Pulmonary Disease

## 2019-11-09 ENCOUNTER — Encounter: Payer: Self-pay | Admitting: Pulmonary Disease

## 2019-11-09 ENCOUNTER — Other Ambulatory Visit: Payer: Self-pay

## 2019-11-09 VITALS — BP 120/76 | HR 90 | Temp 97.4°F | Ht 67.0 in | Wt 166.4 lb

## 2019-11-09 DIAGNOSIS — R0681 Apnea, not elsewhere classified: Secondary | ICD-10-CM | POA: Diagnosis not present

## 2019-11-09 NOTE — Progress Notes (Signed)
Subjective:    Patient ID: Andrea Price, female    DOB: 1961/08/14, 59 y.o.   MRN: [id number]  Patient being seen for witnessed apneas  Usually goes to bed between 10 and 12 midnight Takes about 30 minutes to fall asleep Wakes up up to 5 times during the night Final wake up time about 7 AM  Has known about significant snoring for over 10 years Not really tired during the day We will only fall asleep when she is bored She notices gasping respirations at night No palpitations No headaches  Weight has been stable  She thinks her sweating at night is related to hot flashes Memory is good Dad did have OSA  Hypertension is controlled She is an active smoker, cutting down significantly, trying to quit  History reviewed. No pertinent past medical history. Social History   Socioeconomic History  . Marital status: Divorced    Spouse name: Not on file  . Number of children: Not on file  . Years of education: Not on file  . Highest education level: Not on file  Occupational History  . Not on file  Tobacco Use  . Smoking status: Current Every Day Smoker    Packs/day: 0.50    Years: 40.00    Pack years: 20.00    Types: Cigarettes  . Smokeless tobacco: Never Used  Substance and Sexual Activity  . Alcohol use: Yes    Comment: 3 times per week  . Drug use: Yes    Frequency: 7.0 times per week    Types: Marijuana  . Sexual activity: Yes    Birth control/protection: None  Other Topics Concern  . Not on file  Social History Narrative  . Not on file   Social Determinants of Health   Financial Resource Strain:   . Difficulty of Paying Living Expenses:   Food Insecurity:   . Worried About Charity fundraiser in the Last Year:   . Arboriculturist in the Last Year:   Transportation Needs:   . Film/video editor (Medical):   Marland Kitchen Lack of Transportation (Non-Medical):   Physical Activity:   . Days of Exercise per Week:   . Minutes of Exercise per Session:   Stress:    . Feeling of Stress :   Social Connections:   . Frequency of Communication with Friends and Family:   . Frequency of Social Gatherings with Friends and Family:   . Attends Religious Services:   . Active Member of Clubs or Organizations:   . Attends Archivist Meetings:   Marland Kitchen Marital Status:   Intimate Partner Violence:   . Fear of Current or Ex-Partner:   . Emotionally Abused:   Marland Kitchen Physically Abused:   . Sexually Abused:    Family History  Problem Relation Age of Onset  . Hypertension Father   . Cancer Maternal Aunt        pancreatic  . Cancer Paternal Aunt   . Stroke Paternal Grandmother    Review of Systems  Respiratory: Positive for apnea and shortness of breath.   Psychiatric/Behavioral: Positive for sleep disturbance.      Objective:   Physical Exam Constitutional:      Appearance: Normal appearance.  HENT:     Head: Normocephalic.     Right Ear: Tympanic membrane normal.     Nose: Nose normal. No congestion.     Mouth/Throat:     Mouth: Mucous membranes are moist.  Comments: Mallampati 3, crowded oropharynx Eyes:     Pupils: Pupils are equal, round, and reactive to light.  Cardiovascular:     Rate and Rhythm: Normal rate and regular rhythm.     Pulses: Normal pulses.     Heart sounds: Normal heart sounds. No murmur. No friction rub.  Pulmonary:     Effort: Pulmonary effort is normal. No respiratory distress.     Breath sounds: Normal breath sounds. No stridor. No wheezing or rhonchi.  Musculoskeletal:     Cervical back: Normal range of motion and neck supple.  Neurological:     General: No focal deficit present.     Mental Status: She is alert.  Psychiatric:        Mood and Affect: Mood normal.    Vitals:   11/09/19 0946  BP: 120/76  Pulse: 90  Temp: (!) 97.4 F (36.3 C)  SpO2: 98%   Results of the Epworth flowsheet 11/09/2019  Sitting and reading 0  Watching TV 2  Sitting, inactive in a public place (e.g. a theatre or a meeting) 0   As a passenger in a car for an hour without a break 0  Lying down to rest in the afternoon when circumstances permit 1  Sitting and talking to someone 0  Sitting quietly after a lunch without alcohol 0  In a car, while stopped for a few minutes in traffic 0  Total score 3      Assessment & Plan:  .  Moderate probability of significant obstructive sleep apnea -Witnessed apneas -Gasping at night  Active smoker  Probable obstructive lung disease-PFT reportedly with moderate obstructive defect, not available at present  Pathophysiology of sleep disordered breathing discussed with the patient Options of treatment for sleep disordered breathing discussed with the patient  She does not appear to have significant daytime symptoms, comorbidities appear well controlled at present  Plan: We will schedule patient for a home sleep study  Counseling regarding quitting smoking  We will follow-up in 3 months

## 2019-11-09 NOTE — Patient Instructions (Signed)
Moderate probability of significant obstructive sleep apnea  We will schedule you for home sleep study Update you with results  Treatment options as we discussed  Continue to focus on quitting smoking  I will see you back in the office in about 3 months  Call with significant concerns  Sleep Apnea Sleep apnea is a condition in which breathing pauses or becomes shallow during sleep. Episodes of sleep apnea usually last 10 seconds or longer, and they may occur as many as 20 times an hour. Sleep apnea disrupts your sleep and keeps your body from getting the rest that it needs. This condition can increase your risk of certain health problems, including:  Heart attack.  Stroke.  Obesity.  Diabetes.  Heart failure.  Irregular heartbeat. What are the causes? There are three kinds of sleep apnea:  Obstructive sleep apnea. This kind is caused by a blocked or collapsed airway.  Central sleep apnea. This kind happens when the part of the brain that controls breathing does not send the correct signals to the muscles that control breathing.  Mixed sleep apnea. This is a combination of obstructive and central sleep apnea. The most common cause of this condition is a collapsed or blocked airway. An airway can collapse or become blocked if:  Your throat muscles are abnormally relaxed.  Your tongue and tonsils are larger than normal.  You are overweight.  Your airway is smaller than normal. What increases the risk? You are more likely to develop this condition if you:  Are overweight.  Smoke.  Have a smaller than normal airway.  Are elderly.  Are female.  Drink alcohol.  Take sedatives or tranquilizers.  Have a family history of sleep apnea. What are the signs or symptoms? Symptoms of this condition include:  Trouble staying asleep.  Daytime sleepiness and tiredness.  Irritability.  Loud snoring.  Morning headaches.  Trouble  concentrating.  Forgetfulness.  Decreased interest in sex.  Unexplained sleepiness.  Mood swings.  Personality changes.  Feelings of depression.  Waking up often during the night to urinate.  Dry mouth.  Sore throat. How is this diagnosed? This condition may be diagnosed with:  A medical history.  A physical exam.  A series of tests that are done while you are sleeping (sleep study). These tests are usually done in a sleep lab, but they may also be done at home. How is this treated? Treatment for this condition aims to restore normal breathing and to ease symptoms during sleep. It may involve managing health issues that can affect breathing, such as high blood pressure or obesity. Treatment may include:  Sleeping on your side.  Using a decongestant if you have nasal congestion.  Avoiding the use of depressants, including alcohol, sedatives, and narcotics.  Losing weight if you are overweight.  Making changes to your diet.  Quitting smoking.  Using a device to open your airway while you sleep, such as: ? An oral appliance. This is a custom-made mouthpiece that shifts your lower jaw forward. ? A continuous positive airway pressure (CPAP) device. This device blows air through a mask when you breathe out (exhale). ? A nasal expiratory positive airway pressure (EPAP) device. This device has valves that you put into each nostril. ? A bi-level positive airway pressure (BPAP) device. This device blows air through a mask when you breathe in (inhale) and breathe out (exhale).  Having surgery if other treatments do not work. During surgery, excess tissue is removed to create a wider  airway. It is important to get treatment for sleep apnea. Without treatment, this condition can lead to:  High blood pressure.  Coronary artery disease.  In men, an inability to achieve or maintain an erection (impotence).  Reduced thinking abilities. Follow these instructions at  home: Lifestyle  Make any lifestyle changes that your health care provider recommends.  Eat a healthy, well-balanced diet.  Take steps to lose weight if you are overweight.  Avoid using depressants, including alcohol, sedatives, and narcotics.  Do not use any products that contain nicotine or tobacco, such as cigarettes, e-cigarettes, and chewing tobacco. If you need help quitting, ask your health care provider. General instructions  Take over-the-counter and prescription medicines only as told by your health care provider.  If you were given a device to open your airway while you sleep, use it only as told by your health care provider.  If you are having surgery, make sure to tell your health care provider you have sleep apnea. You may need to bring your device with you.  Keep all follow-up visits as told by your health care provider. This is important. Contact a health care provider if:  The device that you received to open your airway during sleep is uncomfortable or does not seem to be working.  Your symptoms do not improve.  Your symptoms get worse. Get help right away if:  You develop: ? Chest pain. ? Shortness of breath. ? Discomfort in your back, arms, or stomach.  You have: ? Trouble speaking. ? Weakness on one side of your body. ? Drooping in your face. These symptoms may represent a serious problem that is an emergency. Do not wait to see if the symptoms will go away. Get medical help right away. Call your local emergency services (911 in the [place] Do not drive yourself to the hospital. Summary  Sleep apnea is a condition in which breathing pauses or becomes shallow during sleep.  The most common cause is a collapsed or blocked airway.  The goal of treatment is to restore normal breathing and to ease symptoms during sleep. This information is not intended to replace advice given to you by your health care provider. Make sure you discuss any questions you  have with your health care provider. Document Revised: 01/21/2019 Document Reviewed: 04/01/2018 Elsevier Patient Education  [place].

## 2019-11-18 ENCOUNTER — Ambulatory Visit: Payer: BC Managed Care – PPO

## 2019-11-18 ENCOUNTER — Other Ambulatory Visit: Payer: Self-pay

## 2019-11-18 DIAGNOSIS — G4733 Obstructive sleep apnea (adult) (pediatric): Secondary | ICD-10-CM | POA: Diagnosis not present

## 2019-11-18 DIAGNOSIS — R0681 Apnea, not elsewhere classified: Secondary | ICD-10-CM

## 2019-11-24 DIAGNOSIS — G4733 Obstructive sleep apnea (adult) (pediatric): Secondary | ICD-10-CM | POA: Diagnosis not present

## 2019-11-25 ENCOUNTER — Telehealth: Payer: Self-pay | Admitting: Pulmonary Disease

## 2019-11-25 NOTE — Telephone Encounter (Signed)
ATC patient.  LM for Patient to return call for sleep results.    Impressions:  Mild obstructive sleep apnea Mild oxygent desaturations  Patient denies significant symptoms of sleep disordered breathing.  Sleep position optimization by encouraging sleep in a lateral position, elevating the head of the bed bu 30 degrees may help.  Encourage regular exercise  Optimize sleep hygiene and obtain at least 6-7 hours of sleep.  Increase in weight will likely lead to worsening sleep disordered breathing.  Caution against driving when sleepy and against medications with sedative effects.

## 2019-11-25 NOTE — Telephone Encounter (Signed)
Patient returned called.  Dr. Trena Platt results and recommendations given.  Understanding stated.  Nothing further at this time.

## 2020-02-26 ENCOUNTER — Encounter (HOSPITAL_COMMUNITY): Payer: Self-pay

## 2020-02-26 ENCOUNTER — Ambulatory Visit (HOSPITAL_COMMUNITY)
Admission: EM | Admit: 2020-02-26 | Discharge: 2020-02-26 | Disposition: A | Discharge status: Home / Self Care | Payer: BC Managed Care – PPO | Attending: Family Medicine | Admitting: Family Medicine

## 2020-02-26 ENCOUNTER — Other Ambulatory Visit: Payer: Self-pay

## 2020-02-26 DIAGNOSIS — L299 Pruritus, unspecified: Secondary | ICD-10-CM | POA: Diagnosis not present

## 2020-02-26 DIAGNOSIS — R208 Other disturbances of skin sensation: Secondary | ICD-10-CM

## 2020-02-26 MED ORDER — TRIAMCINOLONE ACETONIDE 0.1 % EX CREA: 1.0000 "application " | TOPICAL_CREAM | Freq: Two times a day (BID) | CUTANEOUS | 0 refills | Status: DC

## 2020-02-26 MED ORDER — VALACYCLOVIR HCL 1 G PO TABS: 1000.0000 mg | ORAL_TABLET | Freq: Three times a day (TID) | ORAL | 0 refills | Status: AC

## 2020-02-26 NOTE — ED Triage Notes (Signed)
Pt presents with itching and burning sensation in right hand x 1 week. States feels the same as when she has shingles.

## 2020-02-26 NOTE — ED Provider Notes (Signed)
[facility]    CSN: [id number] Arrival date & time: 02/26/20  1051      History   Chief Complaint Chief Complaint  Patient presents with  . Pruritis    HPI Andrea Price is a 59 y.o. female.   Andrea Price presents with complaints of itching and burning to dorsum of right hand, which started approximately 4 days ago. History of shingles and states that it feels similar, feels like it is a more internal burning sensation. No rash, redness, vesicles present. She had been at her son's home and feels like she was cleaning more than usual. No history of eczema. Shingles to her trunk approximately 4 months ago. No injury to the area. No pain with movement of the fingers.    ROS per HPI, negative if not otherwise mentioned.      History reviewed. No pertinent past medical history.  There are no problems to display for this patient.   Past Surgical History:  Procedure Laterality Date  . BREAST SURGERY     left breast  . HERNIA REPAIR      OB History    Gravida  4   Para  2   Term  2   Preterm      AB  2   Living  2     SAB      TAB  2   Ectopic      Multiple      Live Births               Home Medications    Prior to Admission medications   Medication Sig Start Date End Date Taking? Authorizing Provider  hydrochlorothiazide (HYDRODIURIL) 12.5 MG tablet Take 12.5 mg by mouth daily. 09/07/19   [provider]  triamcinolone cream (KENALOG) 0.1 % Apply 1 application topically 2 (two) times daily. 02/26/20   Georgetta Haber, NP  valACYclovir (VALTREX) 1000 MG tablet Take 1 tablet (1,000 mg total) by mouth 3 (three) times daily for 7 days. 02/26/20 03/04/20  Georgetta Haber, NP  VENTOLIN HFA 108 (90 Base) MCG/ACT inhaler Inhale 2 puffs into the lungs every 6 (six) hours as needed.  09/23/19   [provider]    Family History Family History  Problem Relation Age of Onset  . Hypertension Father   . Cancer Maternal Aunt          pancreatic  . Cancer Paternal Aunt   . Stroke Paternal Grandmother     Social History Social History   Tobacco Use  . Smoking status: Current Every Day Smoker    Packs/day: 0.50    Years: 40.00    Pack years: 20.00    Types: Cigarettes  . Smokeless tobacco: Never Used  Substance Use Topics  . Alcohol use: Yes    Comment: 3 times per week  . Drug use: Yes    Frequency: 7.0 times per week    Types: Marijuana     Allergies   Patient has no known allergies.   Review of Systems Review of Systems   Physical Exam Triage Vital Signs ED Triage Vitals [02/26/20 1152]  Enc Vitals Group     BP 137/80     Pulse Rate 80     Resp 18     Temp 98.7 F (37.1 C)     Temp Source Oral     SpO2 99 %     Weight      Height  Head Circumference      Peak Flow      Pain Score 0     Pain Loc      Pain Edu?      Excl. in GC?    No data found.  Updated Vital Signs BP 137/80 (BP Location: Right Arm)   Pulse 80   Temp 98.7 F (37.1 C) (Oral)   Resp 18   LMP 12/16/2011   SpO2 99%   Visual Acuity Right Eye Distance:   Left Eye Distance:   Bilateral Distance:    Right Eye Near:   Left Eye Near:    Bilateral Near:     Physical Exam Constitutional:      General: She is not in acute distress.    Appearance: She is well-developed.  Cardiovascular:     Rate and Rhythm: Normal rate.  Pulmonary:     Effort: Pulmonary effort is normal.  Skin:    General: Skin is warm and dry.          Comments: Dorsum of right hand, between MCP joint of pointer and middle finger, with itching sensation and burning; non tender; full ROM Of fingers and hand; no visible rash; no swelling   Neurological:     Mental Status: She is alert and oriented to person, place, and time.      UC Treatments / Results  Labs (all labs ordered are listed, but only abnormal results are displayed) Labs Reviewed - No data to display  EKG   Radiology No results  found.  Procedures Procedures (including critical care time)  Medications Ordered in UC Medications - No data to display  Initial Impression / Assessment and Plan / UC Course  I have reviewed the triage vital signs and the nursing notes.  Pertinent labs & imaging results that were available during my care of the patient were reviewed by me and considered in my medical decision making (see chart for details).     No visible dermatitis, or vesicles present. Burning and itching sensation which patient feels is similar in sensation to when she had shingles. Covered with valtrex. Cleaning products/ hand washing considered also as source of symptoms, encouraged to limit this as able to limit dryness, use of kenalog prn. Patient verbalized understanding and agreeable to plan.    Final Clinical Impressions(s) / UC Diagnoses   Final diagnoses:  Itching  Burning sensation     Discharge Instructions     We will treat for shingles with valtrex.  This still could be related to cleaning products/ dryness so limit washing your hands/ soap use/ excessive dryness until improved symptoms.  May apply topical steroid cream twice a day to help with itching.  If symptoms worsen or do not improve in the next week to return to be seen or to follow up with your PCP.     ED Prescriptions    Medication Sig Dispense Auth. Provider   valACYclovir (VALTREX) 1000 MG tablet Take 1 tablet (1,000 mg total) by mouth 3 (three) times daily for 7 days. 21 tablet Linus Mako B, NP   triamcinolone cream (KENALOG) 0.1 % Apply 1 application topically 2 (two) times daily. 30 g Georgetta Haber, NP     PDMP not reviewed this encounter.   Georgetta Haber, NP 02/26/20 1304

## 2020-02-26 NOTE — Discharge Instructions (Signed)
We will treat for shingles with valtrex.  This still could be related to cleaning products/ dryness so limit washing your hands/ soap use/ excessive dryness until improved symptoms.  May apply topical steroid cream twice a day to help with itching.  If symptoms worsen or do not improve in the next week to return to be seen or to follow up with your PCP.

## 2021-08-11 DIAGNOSIS — E7800 Pure hypercholesterolemia, unspecified: Secondary | ICD-10-CM | POA: Insufficient documentation

## 2021-08-11 DIAGNOSIS — R1013 Epigastric pain: Secondary | ICD-10-CM | POA: Insufficient documentation

## 2021-08-11 DIAGNOSIS — R252 Cramp and spasm: Secondary | ICD-10-CM | POA: Insufficient documentation

## 2021-11-23 ENCOUNTER — Emergency Department (HOSPITAL_COMMUNITY)
Admission: EM | Admit: 2021-11-23 | Discharge: 2021-11-23 | Disposition: A | Discharge status: Home / Self Care | Payer: BLUE CROSS/BLUE SHIELD | Attending: Emergency Medicine | Admitting: Emergency Medicine

## 2021-11-23 ENCOUNTER — Other Ambulatory Visit: Payer: Self-pay

## 2021-11-23 DIAGNOSIS — Z5321 Procedure and treatment not carried out due to patient leaving prior to being seen by health care provider: Secondary | ICD-10-CM | POA: Insufficient documentation

## 2021-11-23 DIAGNOSIS — R112 Nausea with vomiting, unspecified: Secondary | ICD-10-CM | POA: Insufficient documentation

## 2021-11-23 DIAGNOSIS — R1032 Left lower quadrant pain: Secondary | ICD-10-CM | POA: Insufficient documentation

## 2021-11-23 LAB — URINALYSIS, ROUTINE W REFLEX MICROSCOPIC
Bilirubin Urine: NEGATIVE
Glucose, UA: NEGATIVE mg/dL
Ketones, ur: NEGATIVE mg/dL
Leukocytes,Ua: NEGATIVE
Nitrite: NEGATIVE
Protein, ur: NEGATIVE mg/dL
Specific Gravity, Urine: 1.02 (ref 1.005–1.030)
pH: 5 (ref 5.0–8.0)

## 2021-11-23 LAB — CBC WITH DIFFERENTIAL/PLATELET
Abs Immature Granulocytes: 0.01 10*3/uL (ref 0.00–0.07)
Basophils Absolute: 0 10*3/uL (ref 0.0–0.1)
Basophils Relative: 1 %
Eosinophils Absolute: 0 10*3/uL (ref 0.0–0.5)
Eosinophils Relative: 1 %
HCT: 43.7 % (ref 36.0–46.0)
Hemoglobin: 15 g/dL (ref 12.0–15.0)
Immature Granulocytes: 0 %
Lymphocytes Relative: 20 %
Lymphs Abs: 1.5 10*3/uL (ref 0.7–4.0)
MCH: 31.4 pg (ref 26.0–34.0)
MCHC: 34.3 g/dL (ref 30.0–36.0)
MCV: 91.6 fL (ref 80.0–100.0)
Monocytes Absolute: 0.7 10*3/uL (ref 0.1–1.0)
Monocytes Relative: 9 %
Neutro Abs: 5.1 10*3/uL (ref 1.7–7.7)
Neutrophils Relative %: 69 %
Platelets: 245 10*3/uL (ref 150–400)
RBC: 4.77 MIL/uL (ref 3.87–5.11)
RDW: 14.1 % (ref 11.5–15.5)
WBC: 7.3 10*3/uL (ref 4.0–10.5)
nRBC: 0 % (ref 0.0–0.2)

## 2021-11-23 LAB — COMPREHENSIVE METABOLIC PANEL
ALT: 22 U/L (ref 0–44)
AST: 25 U/L (ref 15–41)
Albumin: 3.9 g/dL (ref 3.5–5.0)
Alkaline Phosphatase: 49 U/L (ref 38–126)
Anion gap: 10 (ref 5–15)
BUN: 10 mg/dL (ref 8–23)
CO2: 25 mmol/L (ref 22–32)
Calcium: 9.2 mg/dL (ref 8.9–10.3)
Chloride: 101 mmol/L (ref 98–111)
Creatinine, Ser: 0.88 mg/dL (ref 0.44–1.00)
GFR, Estimated: >60 mL/min (ref >60)
Glucose, Bld: 138 mg/dL — ABNORMAL HIGH (ref 70–99)
Potassium: 3.2 mmol/L — ABNORMAL LOW (ref 3.5–5.1)
Sodium: 136 mmol/L (ref 135–145)
Total Bilirubin: 1 mg/dL (ref 0.3–1.2)
Total Protein: 7.3 g/dL (ref 6.5–8.1)

## 2021-11-23 LAB — LIPASE, BLOOD: Lipase: 25 U/L (ref 11–51)

## 2021-11-23 NOTE — ED Notes (Signed)
Pt stated that she was upset because of the wait time and the process was exp to her and the pt got mad and left.  ?

## 2021-11-23 NOTE — ED Provider Triage Note (Signed)
Emergency Medicine Provider Triage Evaluation Note ? ?Andrea Price , a 61 y.o. female  was evaluated in triage.  Pt complains of LLQ abdominal pain. She states that same has been bothering her intermittently over the past year with this most recent flare beginning on Monday.  She states on Monday she had many episodes of nausea and vomiting which have since subsided.  She states that since then she has had consistent pain without relief.  Endorses 1 episode of hematochezia yesterday.  States that she went to her doctor yesterday who did a CT scan which showed acute diverticulitis with concern for abscess.  She was subsequently sent here for further evaluation.  She denies any fevers or chills. ? ?Review of Systems  ?Positive: Abdominal pain ?Negative: Fevers, chills ? ?Physical Exam  ?BP 133/82 (BP Location: Right Arm)   Pulse (!) 106   Temp 98.5 ?F (36.9 ?C) (Oral)   Resp 18   LMP 12/16/2011   SpO2 98%  ?Gen:   Awake, no distress   ?Resp:  Normal effort  ?MSK:   Moves extremities without difficulty  ?Other:   ? ?Medical Decision Making  ?Medically screening exam initiated at 9:37 AM.  Appropriate orders placed.  Andrea Price was informed that the remainder of the evaluation will be completed by another provider, this initial triage assessment does not replace that evaluation, and the importance of remaining in the ED until their evaluation is complete. ? ? ?  ?Silva Bandy, PA-C ?11/23/21 7425 ? ?

## 2021-11-23 NOTE — ED Triage Notes (Signed)
Pt. Stated, They think I have an ulcer on my intestines due to diverticulitis , pain in my lower abdominal area started on Monday ?

## 2022-09-26 DIAGNOSIS — H5213 Myopia, bilateral: Secondary | ICD-10-CM | POA: Diagnosis not present

## 2022-11-22 ENCOUNTER — Ambulatory Visit (INDEPENDENT_AMBULATORY_CARE_PROVIDER_SITE_OTHER): Payer: Medicaid Other | Admitting: Family Medicine

## 2022-11-22 ENCOUNTER — Encounter: Payer: Self-pay | Admitting: Family Medicine

## 2022-11-22 VITALS — BP 165/96 | HR 77 | Temp 98.1°F | Resp 16 | Ht 66.0 in | Wt 152.6 lb

## 2022-11-22 DIAGNOSIS — Z122 Encounter for screening for malignant neoplasm of respiratory organs: Secondary | ICD-10-CM

## 2022-11-22 DIAGNOSIS — F17210 Nicotine dependence, cigarettes, uncomplicated: Secondary | ICD-10-CM | POA: Diagnosis not present

## 2022-11-22 DIAGNOSIS — I10 Essential (primary) hypertension: Secondary | ICD-10-CM | POA: Diagnosis not present

## 2022-11-22 DIAGNOSIS — E559 Vitamin D deficiency, unspecified: Secondary | ICD-10-CM | POA: Insufficient documentation

## 2022-11-22 DIAGNOSIS — Z720 Tobacco use: Secondary | ICD-10-CM

## 2022-11-22 DIAGNOSIS — Z7689 Persons encountering health services in other specified circumstances: Secondary | ICD-10-CM

## 2022-11-22 DIAGNOSIS — E785 Hyperlipidemia, unspecified: Secondary | ICD-10-CM

## 2022-11-22 DIAGNOSIS — R202 Paresthesia of skin: Secondary | ICD-10-CM | POA: Insufficient documentation

## 2022-11-22 DIAGNOSIS — Z1211 Encounter for screening for malignant neoplasm of colon: Secondary | ICD-10-CM

## 2022-11-22 NOTE — Progress Notes (Signed)
New Patient Office Visit  Subjective    Patient ID: Andrea Price, female    DOB: 06/01/1961  Age: 62 y.o. MRN: [id number]  CC:  Chief Complaint  Patient presents with   Establish Care    HPI WANNA PARYS presents to establish care. Patient denies acute complaints.    Outpatient Encounter Medications as of 11/22/2022  Medication Sig   hydrochlorothiazide (HYDRODIURIL) 12.5 MG tablet Take 12.5 mg by mouth daily.   albuterol (VENTOLIN HFA) 108 (90 Base) MCG/ACT inhaler 2 puff(s) inhaled every 6 hours for 30 day(s) (Patient not taking: Reported on 11/22/2022)   atorvastatin (LIPITOR) 10 MG tablet Take 1 tablet by mouth daily. (Patient not taking: Reported on 11/22/2022)   hydrOXYzine (ATARAX) 25 MG tablet 1 tab(s) orally 4 times a day PRN FOR ITCHING (Patient not taking: Reported on 11/22/2022)   triamcinolone cream (KENALOG) 0.1 % Apply 1 application topically 2 (two) times daily. (Patient not taking: Reported on 11/22/2022)   VENTOLIN HFA 108 (90 Base) MCG/ACT inhaler Inhale 2 puffs into the lungs every 6 (six) hours as needed.  (Patient not taking: Reported on 11/22/2022)   No facility-administered encounter medications on file as of 11/22/2022.    Past Medical History:  Diagnosis Date   Hypertension     Past Surgical History:  Procedure Laterality Date   BREAST SURGERY     left breast   HERNIA REPAIR      Family History  Problem Relation Age of Onset   Hypertension Father    Cancer Maternal Aunt        pancreatic   Cancer Paternal Aunt    Stroke Paternal Grandmother     Social History   Socioeconomic History   Marital status: Divorced    Spouse name: Not on file   Number of children: Not on file   Years of education: Not on file   Highest education level: Not on file  Occupational History   Not on file  Tobacco Use   Smoking status: Every Day    Packs/day: 0.50    Years: 40.00    Additional pack years: 0.00    Total pack years: 20.00    Types: Cigarettes    Smokeless tobacco: Never  Substance and Sexual Activity   Alcohol use: Yes    Comment: 3 times per week   Drug use: Yes    Frequency: 7.0 times per week    Types: Marijuana   Sexual activity: Yes    Birth control/protection: None  Other Topics Concern   Not on file  Social History Narrative   Not on file   Social Determinants of Health   Financial Resource Strain: Not on file  Food Insecurity: Not on file  Transportation Needs: Not on file  Physical Activity: Not on file  Stress: Not on file  Social Connections: Not on file  Intimate Partner Violence: Not on file    Review of Systems  All other systems reviewed and are negative.       Objective    BP (!) 165/96   Pulse 77   Temp 98.1 F (36.7 C) (Oral)   Resp 16   Ht 5\' 6"  (1.676 m)   Wt 152 lb 9.6 oz (69.2 kg)   LMP 12/16/2011   SpO2 96%   BMI 24.63 kg/m   Physical Exam Vitals and nursing note reviewed.  Constitutional:      General: She is not in acute distress. Cardiovascular:  Rate and Rhythm: Normal rate and regular rhythm.  Pulmonary:     Effort: Pulmonary effort is normal.     Breath sounds: Normal breath sounds.  Abdominal:     Palpations: Abdomen is soft.     Tenderness: There is no abdominal tenderness.  Neurological:     General: No focal deficit present.     Mental Status: She is alert and oriented to person, place, and time.         Assessment & Plan:   1. Essential hypertension Patient with elevated readings. Has just started agent. Continue and monitor  2. Hyperlipidemia, unspecified hyperlipidemia type Continue   3. Tobacco abuse Discussed reduction/cessation  4. Screening for colon cancer Referral for cologuard - Cologuard  5. Screening for lung cancer  - CT CHEST LUNG CA SCREEN LOW DOSE W/O CM; Future  6. Encounter to establish care     Return in about 2 months (around 01/22/2023) for follow up.   Tommie Raymond, MD

## 2022-11-26 ENCOUNTER — Encounter: Payer: Self-pay | Admitting: Family Medicine

## 2022-11-28 ENCOUNTER — Telehealth: Payer: Self-pay | Admitting: *Deleted

## 2022-11-28 NOTE — Telephone Encounter (Signed)
I have attempted without success to contact this patient by phone to return their call and I left a message on answering machine   . Copied from CRM [id number]. Topic: General - Other >> Nov 26, 2022  9:15 AM Macon Large wrote: Reason for CRM: Pt stated that her diverticulitis flared up and she has not been able to work and would like a doctor's note for work. Pt stated this was discussed during her appt on 11/22/22 and she thought she would be able to work today but she can't so she would like to requests a doctor's note.

## 2022-12-05 ENCOUNTER — Encounter: Payer: Self-pay | Admitting: Family Medicine

## 2022-12-07 DIAGNOSIS — Z1211 Encounter for screening for malignant neoplasm of colon: Secondary | ICD-10-CM | POA: Diagnosis not present

## 2022-12-14 LAB — COLOGUARD: COLOGUARD: POSITIVE — AB

## 2022-12-17 ENCOUNTER — Other Ambulatory Visit: Payer: Self-pay | Admitting: Family Medicine

## 2022-12-17 DIAGNOSIS — R195 Other fecal abnormalities: Secondary | ICD-10-CM

## 2022-12-21 ENCOUNTER — Ambulatory Visit
Admission: RE | Admit: 2022-12-21 | Discharge: 2022-12-21 | Disposition: A | Discharge status: Home / Self Care | Payer: Medicaid Other | Source: Ambulatory Visit | Attending: Family Medicine | Admitting: Family Medicine

## 2022-12-21 DIAGNOSIS — Z122 Encounter for screening for malignant neoplasm of respiratory organs: Secondary | ICD-10-CM

## 2022-12-27 ENCOUNTER — Telehealth: Payer: Self-pay | Admitting: Gastroenterology

## 2022-12-27 NOTE — Telephone Encounter (Signed)
Good morning Dr. Barron Alvine  The following patient needs to be seen to have a colonoscopy. She went to [facility] once for a diverticulitis appointment but has never had a colonoscopy before. Her records  are available in Epic with Care Everywhere. Please review and advise of scheduling. Thank you

## 2023-01-01 NOTE — Telephone Encounter (Signed)
Left VM to schedule with Dr. Barron Alvine

## 2023-01-04 ENCOUNTER — Encounter: Payer: Self-pay | Admitting: Gastroenterology

## 2023-01-11 ENCOUNTER — Telehealth: Payer: Self-pay | Admitting: *Deleted

## 2023-01-11 NOTE — Telephone Encounter (Signed)
Attempt to reach pt to reschedule pre-visit to another time in another room on same day or another available.. LM with call back #.

## 2023-01-15 NOTE — Telephone Encounter (Signed)
Patient called back was advised the date and time is fine no reasons noted for reschedule.

## 2023-02-01 ENCOUNTER — Ambulatory Visit (AMBULATORY_SURGERY_CENTER): Payer: Medicaid Other | Admitting: *Deleted

## 2023-02-01 VITALS — Ht 66.75 in | Wt 145.0 lb

## 2023-02-01 DIAGNOSIS — R195 Other fecal abnormalities: Secondary | ICD-10-CM

## 2023-02-01 DIAGNOSIS — Z1211 Encounter for screening for malignant neoplasm of colon: Secondary | ICD-10-CM

## 2023-02-01 MED ORDER — PEG 3350-KCL-NA BICARB-NACL 420 G PO SOLR: 4000.0000 mL | Freq: Once | ORAL | 0 refills | Status: AC

## 2023-02-01 NOTE — Progress Notes (Signed)
Pt's name and DOB verified at the beginning of the pre-visit.  Pt denies any difficulty with ambulating,sitting, laying down or rolling side to side Gave both [facility] main # and MD on call # prior to instructions.  No egg or soy allergy known to patient  No issues known to pt with past sedation with any surgeries or procedures Pt denies having issues being intubated Pt has no issues moving head neck or swallowing No FH of Malignant Hyperthermia Pt is not on diet pills Pt is not on home 02  Pt is not on blood thinners  Pt denies issues with constipation  Pt is not on dialysis Pt denise any abnormal heart rhythms  Pt denies any upcoming cardiac testing Pt encouraged to use to use Singlecare or Goodrx to reduce cost  Patient's chart reviewed by Cathlyn Parsons CNRA prior to pre-visit and patient appropriate for the [facility].  Pre-visit completed and red dot placed by patient's name on their procedure day (on provider's schedule).  . Visit by phone Pt states weight is 145lb Instructed pt why it is important to and  to call if they have any changes in health or new medications. Directed them to the # given and on instructions.   Pt states they will.  Instructions reviewed with pt and pt states understanding. Instructed to review again prior to procedure. Pt states they will.  Instructions sent by mail and by my chart Instructed not smoke Marijuana day before or day of procedure

## 2023-02-18 ENCOUNTER — Encounter: Payer: Self-pay | Admitting: Gastroenterology

## 2023-02-18 ENCOUNTER — Ambulatory Visit (AMBULATORY_SURGERY_CENTER): Payer: Medicaid Other | Admitting: Gastroenterology

## 2023-02-18 VITALS — BP 135/95 | HR 70 | Temp 96.8°F | Resp 18 | Ht 66.0 in | Wt 145.0 lb

## 2023-02-18 DIAGNOSIS — D125 Benign neoplasm of sigmoid colon: Secondary | ICD-10-CM

## 2023-02-18 DIAGNOSIS — K5730 Diverticulosis of large intestine without perforation or abscess without bleeding: Secondary | ICD-10-CM

## 2023-02-18 DIAGNOSIS — R195 Other fecal abnormalities: Secondary | ICD-10-CM | POA: Diagnosis not present

## 2023-02-18 DIAGNOSIS — K621 Rectal polyp: Secondary | ICD-10-CM | POA: Diagnosis not present

## 2023-02-18 DIAGNOSIS — K635 Polyp of colon: Secondary | ICD-10-CM | POA: Diagnosis not present

## 2023-02-18 DIAGNOSIS — D128 Benign neoplasm of rectum: Secondary | ICD-10-CM | POA: Diagnosis not present

## 2023-02-18 DIAGNOSIS — Z1211 Encounter for screening for malignant neoplasm of colon: Secondary | ICD-10-CM | POA: Diagnosis not present

## 2023-02-18 MED ORDER — SODIUM CHLORIDE 0.9 % IV SOLN: 500.0000 mL | Freq: Once | INTRAVENOUS | Status: DC

## 2023-02-18 NOTE — Progress Notes (Signed)
Called to room to assist during endoscopic procedure.  Patient ID and intended procedure confirmed with present staff. Received instructions for my participation in the procedure from the performing physician.  

## 2023-02-18 NOTE — Progress Notes (Signed)
GASTROENTEROLOGY PROCEDURE H&P NOTE   Primary Care Physician: Georganna Skeans, MD    Reason for Procedure:  +Cologuard test, Colon Cancer screening  Plan:    Colonoscopy  Patient is appropriate for endoscopic procedure(s) in the ambulatory ([facility]) setting.  The nature of the procedure, as well as the risks, benefits, and alternatives were carefully and thoroughly reviewed with the patient. Ample time for discussion and questions allowed. The patient understood, was satisfied, and agreed to proceed.     HPI: Andrea Price is a 62 y.o. female who presents for colonoscopy for evaluation of recent + cologuard and for Colon Cancer screening.  Hx of diverticulitis in 11/2021.   Cousin with colon cancer. Patient is otherwise without complaints or active issues today.  Past Medical History:  Diagnosis Date   Hypertension     Past Surgical History:  Procedure Laterality Date   BREAST SURGERY     left breast   HERNIA REPAIR      Prior to Admission medications   Medication Sig Start Date End Date Taking? Authorizing Provider  hydrochlorothiazide (HYDRODIURIL) 12.5 MG tablet Take 12.5 mg by mouth daily. 09/07/19  Yes [provider]    Current Outpatient Medications  Medication Sig Dispense Refill   hydrochlorothiazide (HYDRODIURIL) 12.5 MG tablet Take 12.5 mg by mouth daily.     Current Facility-Administered Medications  Medication Dose Route Frequency Provider Last Rate Last Admin   0.9 %  sodium chloride infusion  500 mL Intravenous Once Lariah Fleer V, DO        Allergies as of 02/18/2023   (No Known Allergies)    Family History  Problem Relation Age of Onset   Hypertension Father    Cancer Maternal Aunt        pancreatic   Cancer Paternal Aunt    Stroke Paternal Grandmother    Colon cancer Cousin    Colon polyps Neg Hx    Esophageal cancer Neg Hx    Rectal cancer Neg Hx    Stomach cancer Neg Hx     Social History   Socioeconomic History    Marital status: Divorced    Spouse name: Not on file   Number of children: Not on file   Years of education: Not on file   Highest education level: Not on file  Occupational History   Not on file  Tobacco Use   Smoking status: Every Day    Packs/day: 0.50    Years: 40.00    Additional pack years: 0.00    Total pack years: 20.00    Types: Cigarettes   Smokeless tobacco: Never  Substance and Sexual Activity   Alcohol use: Yes    Comment: 3 times per week   Drug use: Yes    Frequency: 7.0 times per week    Types: Marijuana   Sexual activity: Yes    Birth control/protection: None  Other Topics Concern   Not on file  Social History Narrative   Not on file   Social Determinants of Health   Financial Resource Strain: Not on file  Food Insecurity: Not on file  Transportation Needs: Not on file  Physical Activity: Not on file  Stress: Not on file  Social Connections: Not on file  Intimate Partner Violence: Not on file    Physical Exam: Vital signs in last 24 hours: @BP  (!) 152/95   Pulse 80   Temp (!) 96.8 F (36 C)   Ht 5\' 6"  (1.676 m)  Wt 145 lb (65.8 kg)   LMP 12/16/2011   SpO2 98%   BMI 23.40 kg/m  GEN: NAD EYE: Sclerae anicteric ENT: MMM CV: Non-tachycardic Pulm: CTA b/l GI: Soft, NT/ND NEURO:  Alert & Oriented x 3   Doristine Locks, DO [facility]   02/18/2023 8:15 AM

## 2023-02-18 NOTE — Patient Instructions (Signed)
Please read handouts provided. Continue present medications. Await pathology results. Return to GI office as needed.   YOU HAD AN ENDOSCOPIC PROCEDURE TODAY AT THE [facility]:   Refer to the procedure report that was given to you for any specific questions about what was found during the examination.  If the procedure report does not answer your questions, please call your gastroenterologist to clarify.  If you requested that your care partner not be given the details of your procedure findings, then the procedure report has been included in a sealed envelope for you to review at your convenience later.  YOU SHOULD EXPECT: Some feelings of bloating in the abdomen. Passage of more gas than usual.  Walking can help get rid of the air that was put into your GI tract during the procedure and reduce the bloating. If you had a lower endoscopy (such as a colonoscopy or flexible sigmoidoscopy) you may notice spotting of blood in your stool or on the toilet paper. If you underwent a bowel prep for your procedure, you may not have a normal bowel movement for a few days.  Please Note:  You might notice some irritation and congestion in your nose or some drainage.  This is from the oxygen used during your procedure.  There is no need for concern and it should clear up in a day or so.  SYMPTOMS TO REPORT IMMEDIATELY:  Following lower endoscopy (colonoscopy or flexible sigmoidoscopy):  Excessive amounts of blood in the stool  Significant tenderness or worsening of abdominal pains  Swelling of the abdomen that is new, acute  Fever of 100F or higher   For urgent or emergent issues, a gastroenterologist can be reached at any hour by calling [phone]. Do not use MyChart messaging for urgent concerns.    DIET:  We do recommend a small meal at first, but then you may proceed to your regular diet.  Drink plenty of fluids but you should avoid alcoholic beverages for 24 hours.  ACTIVITY:   You should plan to take it easy for the rest of today and you should NOT DRIVE or use heavy machinery until tomorrow (because of the sedation medicines used during the test).    FOLLOW UP: Our staff will call the number listed on your records the next business day following your procedure.  We will call around 7:15- 8:00 am to check on you and address any questions or concerns that you may have regarding the information given to you following your procedure. If we do not reach you, we will leave a message.     If any biopsies were taken you will be contacted by phone or by letter within the next 1-3 weeks.  Please call us at [phone] if you have not heard about the biopsies in 3 weeks.    SIGNATURES/CONFIDENTIALITY: You and/or your care partner have signed paperwork which will be entered into your electronic medical record.  These signatures attest to the fact that that the information above on your After Visit Summary has been reviewed and is understood.  Full responsibility of the confidentiality of this discharge information lies with you and/or your care-partner. 

## 2023-02-18 NOTE — Progress Notes (Signed)
Pt's states no medical or surgical changes since previsit or office visit. 

## 2023-02-18 NOTE — Op Note (Signed)
[facility] Patient Name: Andrea Price Procedure Date: 02/18/2023 8:17 AM MRN: [id number] Endoscopist: Doristine Locks , MD, [id number] Age: 62 Referring MD:  Date of Birth: [birth year] Gender: Female Account #: [id number] Procedure:                Colonoscopy Indications:              This is the patient's first colonoscopy, Positive                            Cologuard test                           Hx of diverticulitis in 11/2021. Medicines:                Monitored Anesthesia Care Procedure:                Pre-Anesthesia Assessment:                           - Prior to the procedure, a History and Physical                            was performed, and patient medications and                            allergies were reviewed. The patient's tolerance of                            previous anesthesia was also reviewed. The risks                            and benefits of the procedure and the sedation                            options and risks were discussed with the patient.                            All questions were answered, and informed consent                            was obtained. Prior Anticoagulants: The patient has                            taken no anticoagulant or antiplatelet agents. ASA                            Grade Assessment: II - A patient with mild systemic                            disease. After reviewing the risks and benefits,                            the patient was deemed in satisfactory condition to  undergo the procedure.                           After obtaining informed consent, the colonoscope                            was passed under direct vision. Throughout the                            procedure, the patient's blood pressure, pulse, and                            oxygen saturations were monitored continuously. The                            CF [id number] #[id number] was introduced through the anus                             and advanced to the the sigmoid colon and replaced                            with the smaller Ultraslim scope. The [id number]                            Slim SN [id number] was introduced through the anus and                            advanced to the the cecum, identified by                            appendiceal orifice and ileocecal valve. The                            colonoscopy was technically difficult and complex                            due to multiple diverticula in the colon and a                            tortuous colon. Successful completion of the                            procedure was aided by withdrawing the scope and                            replacing with the UltraSlim scope. The patient                            tolerated the procedure well. The quality of the                            bowel preparation was good. The ileocecal valve,  appendiceal orifice, and rectum were photographed. Scope In: 8:22:08 AM Scope Out: 8:59:51 AM Total Procedure Duration: 0 hours 37 minutes 43 seconds  Findings:                 The perianal and digital rectal examinations were                            normal.                           Four sessile polyps were found in the distal                            sigmoid colon. The polyps were 2 to 6 mm in size.                            These polyps were removed with a cold snare.                            Resection and retrieval were complete. Estimated                            blood loss was minimal.                           Six sessile polyps were found in the rectum. The                            polyps were 2 to 4 mm in size. These polyps were                            removed with a cold snare. Resection and retrieval                            were complete. Estimated blood loss was minimal.                           Multiple large-mouthed and small-mouthed                             diverticula were found in the sigmoid colon,                            transverse colon and ascending colon.                           The sigmoid colon was significantly tortuous in a                            field of dense diverticulitis. Advancing the scope                            required changing endoscopes to the Ultraslim scope  and water immersion.                           The retroflexed view of the distal rectum and anal                            verge was normal and showed no anal or rectal                            abnormalities. Complications:            No immediate complications. Estimated Blood Loss:     Estimated blood loss was minimal. Impression:               - Four 2 to 6 mm polyps in the distal sigmoid                            colon, removed with a cold snare. Resected and                            retrieved.                           - Six 2 to 4 mm polyps in the rectum, removed with                            a cold snare. Resected and retrieved.                           - Diverticulosis in the sigmoid colon, in the                            transverse colon and in the ascending colon.                           - Tortuous colon.                           - The distal rectum and anal verge are normal on                            retroflexion view. Recommendation:           - Patient has a contact number available for                            emergencies. The signs and symptoms of potential                            delayed complications were discussed with the                            patient. Return to normal activities tomorrow.                            Written discharge instructions  were provided to the                            patient.                           - Resume previous diet.                           - Continue present medications.                           - Await pathology results.                            - Repeat colonoscopy for surveillance based on                            pathology results.                           - Return to GI office PRN.                           - Repeat colonoscopy shoudl be done with Ultraslim                            scope. Doristine Locks, MD 02/18/2023 9:07:29 AM

## 2023-02-18 NOTE — Progress Notes (Signed)
Vss nad trans to pacu 

## 2023-02-19 ENCOUNTER — Telehealth: Payer: Self-pay

## 2023-02-19 NOTE — Telephone Encounter (Signed)
No answer, left message to call if having any issues or concerns, B.Arelis Neumeier RN 

## 2023-02-26 ENCOUNTER — Telehealth: Payer: Self-pay

## 2023-02-26 ENCOUNTER — Ambulatory Visit (INDEPENDENT_AMBULATORY_CARE_PROVIDER_SITE_OTHER): Payer: Medicaid Other | Admitting: Family Medicine

## 2023-02-26 VITALS — BP 146/89 | Temp 98.1°F | Resp 16 | Wt 146.6 lb

## 2023-02-26 DIAGNOSIS — R630 Anorexia: Secondary | ICD-10-CM | POA: Diagnosis not present

## 2023-02-26 DIAGNOSIS — I10 Essential (primary) hypertension: Secondary | ICD-10-CM | POA: Diagnosis not present

## 2023-02-26 DIAGNOSIS — E785 Hyperlipidemia, unspecified: Secondary | ICD-10-CM

## 2023-02-26 DIAGNOSIS — Z720 Tobacco use: Secondary | ICD-10-CM

## 2023-02-26 MED ORDER — HYDROCHLOROTHIAZIDE 12.5 MG PO TABS: 12.5000 mg | ORAL_TABLET | Freq: Every day | ORAL | 1 refills | Status: DC

## 2023-02-26 MED ORDER — MEGESTROL ACETATE 20 MG PO TABS: 20.0000 mg | ORAL_TABLET | Freq: Every day | ORAL | 0 refills | Status: DC

## 2023-02-26 NOTE — Progress Notes (Unsigned)
Patient is here for their 3 month follow-up Patient has no concerns today Care gaps have been discussed with patient  

## 2023-02-26 NOTE — Telephone Encounter (Signed)
Chart review completed for patient. Patient is due for screening mammogram. Mychart message sent to patient to inquire about scheduling mammogram.  Fedrick Cefalu, Population Health Specialist.  

## 2023-02-26 NOTE — Progress Notes (Unsigned)
Established Patient Office Visit  Subjective    Patient ID: Andrea Price, female    DOB: Sep 08, 1960  Age: 62 y.o. MRN: [id number]  CC:  Chief Complaint  Patient presents with   Follow-up   Hypertension    HPI Andrea Price presents for follow up of chronic med issues. Patient also reports that she has not been having an appetite and is now losing weight. Patient drinks 2 boost/day.    Outpatient Encounter Medications as of 02/26/2023  Medication Sig   [DISCONTINUED] hydrochlorothiazide (HYDRODIURIL) 12.5 MG tablet Take 12.5 mg by mouth daily.   hydrochlorothiazide (HYDRODIURIL) 12.5 MG tablet Take 1 tablet (12.5 mg total) by mouth daily.   No facility-administered encounter medications on file as of 02/26/2023.    Past Medical History:  Diagnosis Date   Hypertension     Past Surgical History:  Procedure Laterality Date   BREAST SURGERY     left breast   HERNIA REPAIR      Family History  Problem Relation Age of Onset   Hypertension Father    Cancer Maternal Aunt        pancreatic   Cancer Paternal Aunt    Stroke Paternal Grandmother    Colon cancer Cousin    Colon polyps Neg Hx    Esophageal cancer Neg Hx    Rectal cancer Neg Hx    Stomach cancer Neg Hx     Social History   Socioeconomic History   Marital status: Divorced    Spouse name: Not on file   Number of children: Not on file   Years of education: Not on file   Highest education level: Some college, no degree  Occupational History   Not on file  Tobacco Use   Smoking status: Every Day    Packs/day: 0.50    Years: 40.00    Additional pack years: 0.00    Total pack years: 20.00    Types: Cigarettes   Smokeless tobacco: Never  Substance and Sexual Activity   Alcohol use: Yes    Comment: 3 times per week   Drug use: Yes    Frequency: 7.0 times per week    Types: Marijuana   Sexual activity: Yes    Birth control/protection: None  Other Topics Concern   Not on file  Social History  Narrative   Not on file   Social Determinants of Health   Financial Resource Strain: Low Risk  (02/26/2023)   Overall Financial Resource Strain (CARDIA)    Difficulty of Paying Living Expenses: Not hard at all  Food Insecurity: No Food Insecurity (02/26/2023)   Hunger Vital Sign    Worried About Running Out of Food in the Last Year: Never true    Ran Out of Food in the Last Year: Never true  Transportation Needs: No Transportation Needs (02/26/2023)   PRAPARE - Administrator, Civil Service (Medical): No    Lack of Transportation (Non-Medical): No  Physical Activity: Insufficiently Active (02/26/2023)   Exercise Vital Sign    Days of Exercise per Week: 2 days    Minutes of Exercise per Session: 10 min  Stress: No Stress Concern Present (02/26/2023)   [employer] of Occupational Health - Occupational Stress Questionnaire    Feeling of Stress : Not at all  Social Connections: Socially Isolated (02/26/2023)   Social Connection and Isolation Panel [NHANES]    Frequency of Communication with Friends and Family: More than three times a week  Frequency of Social Gatherings with Friends and Family: Once a week    Attends Religious Services: Never    Database administrator or Organizations: No    Attends Engineer, structural: Not on file    Marital Status: Divorced  Intimate Partner Violence: Not on file    Review of Systems  Constitutional:  Positive for weight loss.  All other systems reviewed and are negative.       Objective    BP (!) 146/89   Temp 98.1 F (36.7 C) (Oral)   Resp 16   Wt 146 lb 10.1 oz (66.5 kg)   LMP 12/16/2011   SpO2 92%   BMI 23.67 kg/m   Physical Exam Vitals and nursing note reviewed.  Constitutional:      General: She is not in acute distress. Cardiovascular:     Rate and Rhythm: Normal rate and regular rhythm.  Pulmonary:     Effort: Pulmonary effort is normal.     Breath sounds: Normal breath sounds.  Abdominal:      Palpations: Abdomen is soft.     Tenderness: There is no abdominal tenderness.  Neurological:     General: No focal deficit present.     Mental Status: She is alert and oriented to person, place, and time.         Assessment & Plan:   1. Essential hypertension Meds refilled.   2. Hyperlipidemia, unspecified hyperlipidemia type Continue  3. Loss of appetite Recommend supplementation with Ensure - 2 daily  4. Tobacco abuse Discussed reduction/cessation    No follow-ups on file.   Tommie Raymond, MD

## 2023-02-28 ENCOUNTER — Encounter: Payer: Self-pay | Admitting: Family Medicine

## 2023-04-09 ENCOUNTER — Ambulatory Visit: Payer: Medicaid Other | Admitting: Family Medicine

## 2023-06-11 ENCOUNTER — Ambulatory Visit: Payer: Medicaid Other | Admitting: Family Medicine

## 2023-06-11 ENCOUNTER — Encounter: Payer: Self-pay | Admitting: Family Medicine

## 2023-06-11 VITALS — BP 135/87 | HR 92 | Temp 97.9°F | Resp 16 | Ht 66.75 in | Wt 146.2 lb

## 2023-06-11 DIAGNOSIS — M25521 Pain in right elbow: Secondary | ICD-10-CM | POA: Diagnosis not present

## 2023-06-11 MED ORDER — PREDNISONE 10 MG (21) PO TBPK: ORAL_TABLET | ORAL | 0 refills | Status: DC

## 2023-06-11 NOTE — Progress Notes (Unsigned)
Established Patient Office Visit  Subjective    Patient ID: Andrea Price, female    DOB: 1961-06-22  Age: 62 y.o. MRN: [id number]  CC:  Chief Complaint  Patient presents with   right arm pain    HPI Andrea Price presents for complaint of right elbow pain for about 3-4 weeks. Pain is worsening and she now takes ibuprofen daily but helps minimally. She denies known trauma or injury. Patient is right hand dominant. Denies hx of similar sx.   Outpatient Encounter Medications as of 06/11/2023  Medication Sig   hydrochlorothiazide (HYDRODIURIL) 12.5 MG tablet Take 1 tablet (12.5 mg total) by mouth daily.   predniSONE (STERAPRED UNI-PAK 21 TAB) 10 MG (21) TBPK tablet Take daily as directed   megestrol (MEGACE) 20 MG tablet Take 1 tablet (20 mg total) by mouth daily. (Patient not taking: Reported on 06/11/2023)   No facility-administered encounter medications on file as of 06/11/2023.    Past Medical History:  Diagnosis Date   Hypertension     Past Surgical History:  Procedure Laterality Date   BREAST SURGERY     left breast   HERNIA REPAIR      Family History  Problem Relation Age of Onset   Hypertension Father    Cancer Maternal Aunt        pancreatic   Cancer Paternal Aunt    Stroke Paternal Grandmother    Colon cancer Cousin    Colon polyps Neg Hx    Esophageal cancer Neg Hx    Rectal cancer Neg Hx    Stomach cancer Neg Hx     Social History   Socioeconomic History   Marital status: Divorced    Spouse name: Not on file   Number of children: Not on file   Years of education: Not on file   Highest education level: Some college, no degree  Occupational History   Not on file  Tobacco Use   Smoking status: Every Day    Current packs/day: 0.50    Average packs/day: 0.5 packs/day for 40.0 years (20.0 ttl pk-yrs)    Types: Cigarettes   Smokeless tobacco: Never  Substance and Sexual Activity   Alcohol use: Yes    Comment: 3 times per week   Drug use: Yes     Frequency: 7.0 times per week    Types: Marijuana   Sexual activity: Yes    Birth control/protection: None  Other Topics Concern   Not on file  Social History Narrative   Not on file   Social Determinants of Health   Financial Resource Strain: Low Risk  (02/26/2023)   Overall Financial Resource Strain (CARDIA)    Difficulty of Paying Living Expenses: Not hard at all  Food Insecurity: No Food Insecurity (02/26/2023)   Hunger Vital Sign    Worried About Running Out of Food in the Last Year: Never true    Ran Out of Food in the Last Year: Never true  Transportation Needs: No Transportation Needs (02/26/2023)   PRAPARE - Administrator, Civil Service (Medical): No    Lack of Transportation (Non-Medical): No  Physical Activity: Insufficiently Active (02/26/2023)   Exercise Vital Sign    Days of Exercise per Week: 2 days    Minutes of Exercise per Session: 10 min  Stress: No Stress Concern Present (02/26/2023)   [employer] of Occupational Health - Occupational Stress Questionnaire    Feeling of Stress : Not at all  Social Connections:  Socially Isolated (02/26/2023)   Social Connection and Isolation Panel [NHANES]    Frequency of Communication with Friends and Family: More than three times a week    Frequency of Social Gatherings with Friends and Family: Once a week    Attends Religious Services: Never    Database administrator or Organizations: No    Attends Engineer, structural: Not on file    Marital Status: Divorced  Intimate Partner Violence: Not on file    Review of Systems  All other systems reviewed and are negative.       Objective    BP 135/87 (BP Location: Right Arm, Patient Position: Sitting, Cuff Size: Normal)   Pulse 92   Temp 97.9 F (36.6 C) (Oral)   Resp 16   Ht 5' 6.75" (1.695 m)   Wt 146 lb 3.2 oz (66.3 kg)   LMP 12/16/2011   SpO2 96%   BMI 23.07 kg/m   Physical Exam Vitals and nursing note reviewed.  Constitutional:       General: She is not in acute distress. Cardiovascular:     Rate and Rhythm: Normal rate and regular rhythm.  Pulmonary:     Effort: Pulmonary effort is normal.     Breath sounds: Normal breath sounds.  Musculoskeletal:     Right elbow: No deformity. Decreased range of motion. Tenderness present.  Neurological:     General: No focal deficit present.     Mental Status: She is alert and oriented to person, place, and time.         Assessment & Plan:   1. Right elbow pain Prednisone pack given. Tylenol/nsaids prn. Patient deferred further eval at this time   Return if symptoms worsen or fail to improve.   Tommie Raymond, MD

## 2023-06-12 ENCOUNTER — Encounter: Payer: Self-pay | Admitting: Family Medicine

## 2023-11-25 ENCOUNTER — Ambulatory Visit

## 2024-05-11 ENCOUNTER — Telehealth: Payer: Self-pay | Admitting: Family Medicine

## 2024-05-11 NOTE — Telephone Encounter (Unsigned)
 Copied from CRM [id number]. Topic: Appointments - Scheduling Inquiry for Clinic >> May 11, 2024  3:36 PM Donee H wrote: Reason for CRM: Patient called needing to set an appointment with pcp Raguel Blush next Monday after 9 am if possible regarding having a pap smear. There are no openings until Nov. Please follow up with patient if possible [phone]. She states this is the only day she has off.

## 2024-05-12 NOTE — Telephone Encounter (Signed)
 Pt scheduled on date requested

## 2024-05-18 ENCOUNTER — Encounter: Payer: Self-pay | Admitting: Family Medicine

## 2024-05-18 ENCOUNTER — Ambulatory Visit (INDEPENDENT_AMBULATORY_CARE_PROVIDER_SITE_OTHER): Admitting: Family Medicine

## 2024-05-18 VITALS — BP 143/87 | HR 95 | Ht 66.75 in | Wt 141.6 lb

## 2024-05-18 DIAGNOSIS — Z720 Tobacco use: Secondary | ICD-10-CM

## 2024-05-18 DIAGNOSIS — I10 Essential (primary) hypertension: Secondary | ICD-10-CM

## 2024-05-18 DIAGNOSIS — I868 Varicose veins of other specified sites: Secondary | ICD-10-CM | POA: Diagnosis not present

## 2024-05-18 DIAGNOSIS — M940 Chondrocostal junction syndrome [Tietze]: Secondary | ICD-10-CM

## 2024-05-18 DIAGNOSIS — E785 Hyperlipidemia, unspecified: Secondary | ICD-10-CM | POA: Diagnosis not present

## 2024-05-18 DIAGNOSIS — Z124 Encounter for screening for malignant neoplasm of cervix: Secondary | ICD-10-CM

## 2024-05-18 DIAGNOSIS — Z122 Encounter for screening for malignant neoplasm of respiratory organs: Secondary | ICD-10-CM

## 2024-05-18 MED ORDER — HYDROCHLOROTHIAZIDE 12.5 MG PO TABS: 12.5000 mg | ORAL_TABLET | Freq: Every day | ORAL | 1 refills | Status: AC

## 2024-05-18 MED ORDER — MELOXICAM 15 MG PO TABS: 15.0000 mg | ORAL_TABLET | Freq: Every day | ORAL | 1 refills | Status: AC

## 2024-05-18 NOTE — Progress Notes (Addendum)
 Established Patient Office Visit  Subjective    Patient ID: Andrea Price, female    DOB: 25-Dec-1960  Age: 63 y.o. MRN: [id number]  CC:  Chief Complaint  Patient presents with   Gynecologic Exam    Pt is also concerned about her weight loss     HPI Andrea Price presents for follow up of hypertension and complains of chest wall tenderness for about 1 month and spider veins in her legs. She denies known trauma or injury.   Outpatient Encounter Medications as of 05/18/2024  Medication Sig   hydrochlorothiazide  (HYDRODIURIL ) 12.5 MG tablet Take 1 tablet (12.5 mg total) by mouth daily.   megestrol  (MEGACE ) 20 MG tablet Take 1 tablet (20 mg total) by mouth daily. (Patient not taking: Reported on 06/11/2023)   predniSONE  (STERAPRED UNI-PAK 21 TAB) 10 MG (21) TBPK tablet Take daily as directed   No facility-administered encounter medications on file as of 05/18/2024.    Past Medical History:  Diagnosis Date   Hypertension     Past Surgical History:  Procedure Laterality Date   BREAST SURGERY     left breast   HERNIA REPAIR      Family History  Problem Relation Age of Onset   Hypertension Father    Cancer Maternal Aunt        pancreatic   Cancer Paternal Aunt    Stroke Paternal Grandmother    Colon cancer Cousin    Colon polyps Neg Hx    Esophageal cancer Neg Hx    Rectal cancer Neg Hx    Stomach cancer Neg Hx     Social History   Socioeconomic History   Marital status: Divorced    Spouse name: Not on file   Number of children: Not on file   Years of education: Not on file   Highest education level: Some college, no degree  Occupational History   Not on file  Tobacco Use   Smoking status: Every Day    Current packs/day: 0.50    Average packs/day: 0.5 packs/day for 40.0 years (20.0 ttl pk-yrs)    Types: Cigarettes   Smokeless tobacco: Never  Substance and Sexual Activity   Alcohol use: Yes    Comment: 3 times per week   Drug use: Yes    Frequency: 7.0  times per week    Types: Marijuana   Sexual activity: Yes    Birth control/protection: None  Other Topics Concern   Not on file  Social History Narrative   Not on file   Social Drivers of Health   Financial Resource Strain: Low Risk  (02/26/2023)   Overall Financial Resource Strain (CARDIA)    Difficulty of Paying Living Expenses: Not hard at all  Food Insecurity: No Food Insecurity (02/26/2023)   Hunger Vital Sign    Worried About Running Out of Food in the Last Year: Never true    Ran Out of Food in the Last Year: Never true  Transportation Needs: No Transportation Needs (02/26/2023)   PRAPARE - Administrator, Civil Service (Medical): No    Lack of Transportation (Non-Medical): No  Physical Activity: Insufficiently Active (02/26/2023)   Exercise Vital Sign    Days of Exercise per Week: 2 days    Minutes of Exercise per Session: 10 min  Stress: No Stress Concern Present (02/26/2023)   [employer] of Occupational Health - Occupational Stress Questionnaire    Feeling of Stress : Not at all  Social Connections:  Socially Isolated (02/26/2023)   Social Connection and Isolation Panel    Frequency of Communication with Friends and Family: More than three times a week    Frequency of Social Gatherings with Friends and Family: Once a week    Attends Religious Services: Never    Database administrator or Organizations: No    Attends Engineer, structural: Not on file    Marital Status: Divorced  Intimate Partner Violence: Not on file    Review of Systems  All other systems reviewed and are negative.       Objective    BP (!) 143/87   Pulse 95   Ht 5' 6.75 (1.695 m)   Wt 141 lb 9.6 oz (64.2 kg)   LMP 12/16/2011   SpO2 96%   BMI 22.34 kg/m   Physical Exam Vitals and nursing note reviewed. Exam conducted with a chaperone present.  Constitutional:      General: She is not in acute distress. Cardiovascular:     Rate and Rhythm: Normal rate and regular  rhythm.  Pulmonary:     Effort: Pulmonary effort is normal.     Breath sounds: Normal breath sounds.  Chest:     Chest wall: Tenderness present. No swelling.  Abdominal:     Palpations: Abdomen is soft.     Tenderness: There is no abdominal tenderness.     Hernia: There is no hernia in the left inguinal area or right inguinal area.  Genitourinary:    Exam position: Supine.     Labia:        Right: No lesion.        Left: No lesion.      Vagina: Normal.     Cervix: Normal.     Uterus: Normal.      Adnexa: Right adnexa normal.     Rectum: Normal.  Neurological:     General: No focal deficit present.     Mental Status: She is alert and oriented to person, place, and time.         Assessment & Plan:   1. Essential hypertension (Primary) Continue present management  2. Hyperlipidemia, unspecified hyperlipidemia type continue  3. Spider varicose vein Patient without symptoms  4. Costochondritis Meloxicam prescribed.  5. Tobacco abuse Discussed reduction/cessation  6. Screening for lung cancer  - CT CHEST LUNG CA SCREEN LOW DOSE W/O CM; Future  7. Pap smear for cervical cancer screening Results pending    No follow-ups on file.   Tanda Raguel SQUIBB, MD

## 2024-05-19 ENCOUNTER — Other Ambulatory Visit (HOSPITAL_COMMUNITY)
Admission: RE | Admit: 2024-05-19 | Discharge: 2024-05-19 | Disposition: A | Source: Ambulatory Visit | Attending: Family Medicine | Admitting: Family Medicine

## 2024-05-19 ENCOUNTER — Other Ambulatory Visit: Payer: Self-pay | Admitting: Family Medicine

## 2024-05-19 DIAGNOSIS — Z01419 Encounter for gynecological examination (general) (routine) without abnormal findings: Secondary | ICD-10-CM | POA: Insufficient documentation

## 2024-05-20 ENCOUNTER — Telehealth: Payer: Self-pay

## 2024-05-20 ENCOUNTER — Other Ambulatory Visit (HOSPITAL_COMMUNITY)
Admission: RE | Admit: 2024-05-20 | Discharge: 2024-05-20 | Disposition: A | Discharge status: Home / Self Care | Source: Ambulatory Visit | Attending: Family Medicine | Admitting: Family Medicine

## 2024-05-20 ENCOUNTER — Other Ambulatory Visit: Payer: Self-pay | Admitting: Family Medicine

## 2024-05-20 DIAGNOSIS — Z113 Encounter for screening for infections with a predominantly sexual mode of transmission: Secondary | ICD-10-CM

## 2024-05-20 NOTE — Telephone Encounter (Signed)
 Copied from CRM #[id number]. Topic: Clinical - Prescription Issue >> May 19, 2024  2:48 PM Gustabo D wrote: hydrochlorothiazide  (HYDRODIURIL ) 12.5 MG tablet- Pt says her pcp was suppose to prescribe her something else. She say her pcp yesterday and she was told that she would call her something in andr she doesn't know the name of the other prescription.

## 2024-05-21 ENCOUNTER — Ambulatory Visit: Payer: Self-pay | Admitting: Family Medicine

## 2024-05-21 LAB — CERVICOVAGINAL ANCILLARY ONLY
Bacterial Vaginitis (gardnerella): NEGATIVE
Candida Glabrata: NEGATIVE
Candida Vaginitis: NEGATIVE
Chlamydia: NEGATIVE
Comment: NEGATIVE
Comment: NEGATIVE
Comment: NEGATIVE
Comment: NEGATIVE
Comment: NEGATIVE
Comment: NORMAL
Neisseria Gonorrhea: NEGATIVE
Trichomonas: NEGATIVE

## 2024-05-21 LAB — CYTOLOGY - PAP
Chlamydia: NEGATIVE
Comment: NEGATIVE
Comment: NEGATIVE
Comment: NEGATIVE
Comment: NORMAL
Diagnosis: NEGATIVE
High risk HPV: NEGATIVE
Neisseria Gonorrhea: NEGATIVE
Trichomonas: NEGATIVE

## 2024-05-25 ENCOUNTER — Ambulatory Visit: Payer: Self-pay | Admitting: Family Medicine

## 2024-05-27 NOTE — Progress Notes (Signed)
 Seen by pt via my chart

## 2024-05-29 NOTE — Telephone Encounter (Signed)
 Attempted to call pt for more details, no answer, LVM to call back

## 2024-07-13 ENCOUNTER — Ambulatory Visit: Admitting: Family Medicine
